# Patient Record
Sex: Female | Born: 1995 | Race: White | Hispanic: No | Marital: Married | Smoking: Never smoker
Health system: Southern US, Community
[De-identification: ages and names within clinical notes are randomized; demographics above are authoritative.]

## PROBLEM LIST (undated history)

## (undated) DIAGNOSIS — M1990 Unspecified osteoarthritis, unspecified site: Secondary | ICD-10-CM

## (undated) DIAGNOSIS — O139 Gestational [pregnancy-induced] hypertension without significant proteinuria, unspecified trimester: Secondary | ICD-10-CM

## (undated) DIAGNOSIS — F419 Anxiety disorder, unspecified: Secondary | ICD-10-CM

## (undated) DIAGNOSIS — G43909 Migraine, unspecified, not intractable, without status migrainosus: Secondary | ICD-10-CM

## (undated) DIAGNOSIS — K219 Gastro-esophageal reflux disease without esophagitis: Secondary | ICD-10-CM

## (undated) DIAGNOSIS — J189 Pneumonia, unspecified organism: Secondary | ICD-10-CM

## (undated) DIAGNOSIS — F32A Depression, unspecified: Secondary | ICD-10-CM

## (undated) DIAGNOSIS — R519 Headache, unspecified: Secondary | ICD-10-CM

## (undated) DIAGNOSIS — J45909 Unspecified asthma, uncomplicated: Secondary | ICD-10-CM

## (undated) HISTORY — PX: UPPER GASTROINTESTINAL ENDOSCOPY: SHX188

## (undated) HISTORY — DX: Unspecified asthma, uncomplicated: J45.909

## (undated) HISTORY — PX: WRIST SURGERY: SHX841

## (undated) HISTORY — PX: WISDOM TOOTH EXTRACTION: SHX21

## (undated) HISTORY — DX: Migraine, unspecified, not intractable, without status migrainosus: G43.909

## (undated) HISTORY — DX: Unspecified osteoarthritis, unspecified site: M19.90

## (undated) HISTORY — DX: Gestational (pregnancy-induced) hypertension without significant proteinuria, unspecified trimester: O13.9

## (undated) HISTORY — DX: Anxiety disorder, unspecified: F41.9

## (undated) HISTORY — PX: CHOLECYSTECTOMY, LAPAROSCOPIC: SHX56

## (undated) HISTORY — DX: Gastro-esophageal reflux disease without esophagitis: K21.9

## (undated) HISTORY — DX: Pneumonia, unspecified organism: J18.9

## (undated) HISTORY — DX: Headache, unspecified: R51.9

## (undated) HISTORY — DX: Depression, unspecified: F32.A

---
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
## 2019-12-05 DIAGNOSIS — N920 Excessive and frequent menstruation with regular cycle: Secondary | ICD-10-CM | POA: Diagnosis not present

## 2019-12-05 DIAGNOSIS — Z23 Encounter for immunization: Secondary | ICD-10-CM | POA: Diagnosis not present

## 2019-12-05 DIAGNOSIS — D84821 Immunodeficiency due to drugs: Secondary | ICD-10-CM | POA: Diagnosis not present

## 2019-12-05 DIAGNOSIS — L4050 Arthropathic psoriasis, unspecified: Secondary | ICD-10-CM | POA: Diagnosis not present

## 2020-01-09 DIAGNOSIS — R768 Other specified abnormal immunological findings in serum: Secondary | ICD-10-CM | POA: Diagnosis not present

## 2020-01-09 DIAGNOSIS — G8929 Other chronic pain: Secondary | ICD-10-CM | POA: Diagnosis not present

## 2020-01-09 DIAGNOSIS — L4059 Other psoriatic arthropathy: Secondary | ICD-10-CM | POA: Diagnosis not present

## 2020-01-09 DIAGNOSIS — M2550 Pain in unspecified joint: Secondary | ICD-10-CM | POA: Diagnosis not present

## 2020-02-10 DIAGNOSIS — R072 Precordial pain: Secondary | ICD-10-CM | POA: Diagnosis not present

## 2020-02-10 DIAGNOSIS — R051 Acute cough: Secondary | ICD-10-CM | POA: Diagnosis not present

## 2020-02-10 DIAGNOSIS — J069 Acute upper respiratory infection, unspecified: Secondary | ICD-10-CM | POA: Diagnosis not present

## 2020-02-10 DIAGNOSIS — J45909 Unspecified asthma, uncomplicated: Secondary | ICD-10-CM | POA: Diagnosis not present

## 2020-05-14 DIAGNOSIS — N898 Other specified noninflammatory disorders of vagina: Secondary | ICD-10-CM | POA: Diagnosis not present

## 2020-06-25 DIAGNOSIS — Z0000 Encounter for general adult medical examination without abnormal findings: Secondary | ICD-10-CM | POA: Diagnosis not present

## 2020-06-25 DIAGNOSIS — D84821 Immunodeficiency due to drugs: Secondary | ICD-10-CM | POA: Diagnosis not present

## 2020-06-25 DIAGNOSIS — L4050 Arthropathic psoriasis, unspecified: Secondary | ICD-10-CM | POA: Diagnosis not present

## 2020-06-25 DIAGNOSIS — F419 Anxiety disorder, unspecified: Secondary | ICD-10-CM | POA: Diagnosis not present

## 2020-07-04 DIAGNOSIS — Z20828 Contact with and (suspected) exposure to other viral communicable diseases: Secondary | ICD-10-CM | POA: Diagnosis not present

## 2020-07-04 DIAGNOSIS — R051 Acute cough: Secondary | ICD-10-CM | POA: Diagnosis not present

## 2020-07-24 DIAGNOSIS — T1490XA Injury, unspecified, initial encounter: Secondary | ICD-10-CM | POA: Diagnosis not present

## 2020-07-24 DIAGNOSIS — Z114 Encounter for screening for human immunodeficiency virus [HIV]: Secondary | ICD-10-CM | POA: Diagnosis not present

## 2020-07-24 DIAGNOSIS — Z1159 Encounter for screening for other viral diseases: Secondary | ICD-10-CM | POA: Diagnosis not present

## 2020-07-30 DIAGNOSIS — M2550 Pain in unspecified joint: Secondary | ICD-10-CM | POA: Diagnosis not present

## 2020-07-30 DIAGNOSIS — G8929 Other chronic pain: Secondary | ICD-10-CM | POA: Diagnosis not present

## 2020-07-30 DIAGNOSIS — R768 Other specified abnormal immunological findings in serum: Secondary | ICD-10-CM | POA: Diagnosis not present

## 2020-07-30 DIAGNOSIS — L4059 Other psoriatic arthropathy: Secondary | ICD-10-CM | POA: Diagnosis not present

## 2020-09-03 DIAGNOSIS — L4050 Arthropathic psoriasis, unspecified: Secondary | ICD-10-CM | POA: Diagnosis not present

## 2020-09-03 DIAGNOSIS — L680 Hirsutism: Secondary | ICD-10-CM | POA: Diagnosis not present

## 2020-10-16 DIAGNOSIS — R55 Syncope and collapse: Secondary | ICD-10-CM | POA: Diagnosis not present

## 2020-10-16 DIAGNOSIS — K219 Gastro-esophageal reflux disease without esophagitis: Secondary | ICD-10-CM | POA: Diagnosis not present

## 2020-10-16 DIAGNOSIS — R109 Unspecified abdominal pain: Secondary | ICD-10-CM | POA: Diagnosis not present

## 2020-10-16 DIAGNOSIS — Z888 Allergy status to other drugs, medicaments and biological substances status: Secondary | ICD-10-CM | POA: Diagnosis not present

## 2020-10-16 DIAGNOSIS — R42 Dizziness and giddiness: Secondary | ICD-10-CM | POA: Diagnosis not present

## 2020-10-16 DIAGNOSIS — Z79899 Other long term (current) drug therapy: Secondary | ICD-10-CM | POA: Diagnosis not present

## 2020-10-16 DIAGNOSIS — R110 Nausea: Secondary | ICD-10-CM | POA: Diagnosis not present

## 2020-10-17 DIAGNOSIS — R55 Syncope and collapse: Secondary | ICD-10-CM | POA: Diagnosis not present

## 2020-11-05 DIAGNOSIS — L4059 Other psoriatic arthropathy: Secondary | ICD-10-CM | POA: Diagnosis not present

## 2020-11-05 DIAGNOSIS — R768 Other specified abnormal immunological findings in serum: Secondary | ICD-10-CM | POA: Diagnosis not present

## 2020-11-05 DIAGNOSIS — Z113 Encounter for screening for infections with a predominantly sexual mode of transmission: Secondary | ICD-10-CM | POA: Diagnosis not present

## 2020-11-05 DIAGNOSIS — F419 Anxiety disorder, unspecified: Secondary | ICD-10-CM | POA: Diagnosis not present

## 2020-11-05 DIAGNOSIS — R82998 Other abnormal findings in urine: Secondary | ICD-10-CM | POA: Diagnosis not present

## 2020-11-05 DIAGNOSIS — Z7689 Persons encountering health services in other specified circumstances: Secondary | ICD-10-CM | POA: Diagnosis not present

## 2020-11-05 DIAGNOSIS — G8929 Other chronic pain: Secondary | ICD-10-CM | POA: Diagnosis not present

## 2020-11-05 DIAGNOSIS — N390 Urinary tract infection, site not specified: Secondary | ICD-10-CM | POA: Diagnosis not present

## 2020-11-05 DIAGNOSIS — R300 Dysuria: Secondary | ICD-10-CM | POA: Diagnosis not present

## 2020-11-05 DIAGNOSIS — M2550 Pain in unspecified joint: Secondary | ICD-10-CM | POA: Diagnosis not present

## 2020-11-05 DIAGNOSIS — L4050 Arthropathic psoriasis, unspecified: Secondary | ICD-10-CM | POA: Diagnosis not present

## 2020-11-26 DIAGNOSIS — H52222 Regular astigmatism, left eye: Secondary | ICD-10-CM | POA: Diagnosis not present

## 2020-12-10 DIAGNOSIS — Z6841 Body Mass Index (BMI) 40.0 and over, adult: Secondary | ICD-10-CM | POA: Diagnosis not present

## 2020-12-10 DIAGNOSIS — F4321 Adjustment disorder with depressed mood: Secondary | ICD-10-CM | POA: Diagnosis not present

## 2020-12-24 DIAGNOSIS — Z8759 Personal history of other complications of pregnancy, childbirth and the puerperium: Secondary | ICD-10-CM | POA: Diagnosis not present

## 2020-12-24 DIAGNOSIS — Z3169 Encounter for other general counseling and advice on procreation: Secondary | ICD-10-CM | POA: Diagnosis not present

## 2020-12-24 DIAGNOSIS — Z6841 Body Mass Index (BMI) 40.0 and over, adult: Secondary | ICD-10-CM | POA: Diagnosis not present

## 2020-12-24 DIAGNOSIS — F4321 Adjustment disorder with depressed mood: Secondary | ICD-10-CM | POA: Diagnosis not present

## 2021-01-06 DIAGNOSIS — J029 Acute pharyngitis, unspecified: Secondary | ICD-10-CM | POA: Diagnosis not present

## 2021-01-06 DIAGNOSIS — Z20822 Contact with and (suspected) exposure to covid-19: Secondary | ICD-10-CM | POA: Diagnosis not present

## 2021-01-06 DIAGNOSIS — R059 Cough, unspecified: Secondary | ICD-10-CM | POA: Diagnosis not present

## 2021-03-02 NOTE — L&D Delivery Note (Signed)
Delivery Note Called to room as patient was found to be complete and +2 station. Bed was broken down and pushing initiated. Fetal head was delivered. No nuchal was present. Shoulders and body followed without difficulty. Infant was placed on mother's abdomen, mouth and nose were bulb suctioned and let out a vigorous cry. Delayed cord clamping was performed for 60 seconds. Venous cord blood was collected. Placenta delivered spontaneously. Cervix, vagina, perineum and labia were inspected. A second degree perineal laceration and a right periurethral laceration were noted and repaired in standard fashion. Additional hemostasis achieved using pressure. TXA 1g was given as prophylaxis. Uterus fundus firm. Placenta was inspected, found to be intact with a 3 vessel cord. Counts were correct.   At 1:59 PM a viable and healthy female was delivered via Vaginal, Spontaneous (Presentation:   Occiput Anterior).  APGAR: 8, 9; weight: see infant's chart Placenta status: Spontaneous, Intact.  Cord: 3 vessels with the following complications: None.  Cord pH: N/A  Anesthesia: Epidural Episiotomy: None Lacerations: 2nd degree Suture Repair: 3.0 vicryl and 4.0 Vicryl Est. Blood Loss (mL):  300  Mom to postpartum.  Baby to Couplet care / Skin to Skin.  Steva Ready 09/23/2021, 2:39 PM

## 2021-03-21 LAB — HEPATITIS C ANTIBODY: HCV Ab: NEGATIVE

## 2021-03-21 LAB — OB RESULTS CONSOLE HEPATITIS B SURFACE ANTIGEN: Hepatitis B Surface Ag: NEGATIVE

## 2021-03-21 LAB — OB RESULTS CONSOLE RUBELLA ANTIBODY, IGM: Rubella: NON-IMMUNE/NOT IMMUNE

## 2021-03-21 LAB — OB RESULTS CONSOLE HIV ANTIBODY (ROUTINE TESTING): HIV: NONREACTIVE

## 2021-03-24 LAB — OB RESULTS CONSOLE GC/CHLAMYDIA
Chlamydia: NEGATIVE
Neisseria Gonorrhea: NEGATIVE

## 2021-03-28 ENCOUNTER — Other Ambulatory Visit: Payer: Self-pay | Admitting: Obstetrics and Gynecology

## 2021-03-28 DIAGNOSIS — Z363 Encounter for antenatal screening for malformations: Secondary | ICD-10-CM

## 2021-05-14 ENCOUNTER — Encounter: Payer: Self-pay | Admitting: *Deleted

## 2021-05-19 ENCOUNTER — Ambulatory Visit: Payer: No Typology Code available for payment source | Admitting: *Deleted

## 2021-05-19 ENCOUNTER — Ambulatory Visit: Payer: No Typology Code available for payment source | Attending: Obstetrics and Gynecology

## 2021-05-19 ENCOUNTER — Other Ambulatory Visit: Payer: Self-pay | Admitting: *Deleted

## 2021-05-19 ENCOUNTER — Other Ambulatory Visit: Payer: Self-pay

## 2021-05-19 VITALS — BP 136/73 | HR 91 | Ht 66.0 in

## 2021-05-19 DIAGNOSIS — Z6841 Body Mass Index (BMI) 40.0 and over, adult: Secondary | ICD-10-CM

## 2021-05-19 DIAGNOSIS — R638 Other symptoms and signs concerning food and fluid intake: Secondary | ICD-10-CM

## 2021-05-19 DIAGNOSIS — Z362 Encounter for other antenatal screening follow-up: Secondary | ICD-10-CM

## 2021-05-19 DIAGNOSIS — Z363 Encounter for antenatal screening for malformations: Secondary | ICD-10-CM | POA: Diagnosis present

## 2021-06-19 ENCOUNTER — Ambulatory Visit: Payer: No Typology Code available for payment source | Attending: Obstetrics

## 2021-06-19 ENCOUNTER — Ambulatory Visit: Payer: No Typology Code available for payment source | Admitting: *Deleted

## 2021-06-19 VITALS — BP 134/73 | HR 87

## 2021-06-19 DIAGNOSIS — Z3A23 23 weeks gestation of pregnancy: Secondary | ICD-10-CM | POA: Diagnosis not present

## 2021-06-19 DIAGNOSIS — O99212 Obesity complicating pregnancy, second trimester: Secondary | ICD-10-CM | POA: Insufficient documentation

## 2021-06-19 DIAGNOSIS — R638 Other symptoms and signs concerning food and fluid intake: Secondary | ICD-10-CM | POA: Diagnosis present

## 2021-06-19 DIAGNOSIS — Z362 Encounter for other antenatal screening follow-up: Secondary | ICD-10-CM | POA: Diagnosis present

## 2021-06-20 ENCOUNTER — Other Ambulatory Visit: Payer: Self-pay | Admitting: *Deleted

## 2021-06-20 DIAGNOSIS — O99212 Obesity complicating pregnancy, second trimester: Secondary | ICD-10-CM

## 2021-06-27 ENCOUNTER — Inpatient Hospital Stay (HOSPITAL_COMMUNITY)
Admission: AD | Admit: 2021-06-27 | Discharge: 2021-06-27 | Disposition: A | Discharge status: Home / Self Care | Payer: No Typology Code available for payment source | Attending: Obstetrics and Gynecology | Admitting: Obstetrics and Gynecology

## 2021-06-27 DIAGNOSIS — R030 Elevated blood-pressure reading, without diagnosis of hypertension: Secondary | ICD-10-CM | POA: Diagnosis not present

## 2021-06-27 DIAGNOSIS — O26892 Other specified pregnancy related conditions, second trimester: Secondary | ICD-10-CM | POA: Insufficient documentation

## 2021-06-27 DIAGNOSIS — O368120 Decreased fetal movements, second trimester, not applicable or unspecified: Secondary | ICD-10-CM | POA: Diagnosis present

## 2021-06-27 DIAGNOSIS — O162 Unspecified maternal hypertension, second trimester: Secondary | ICD-10-CM | POA: Diagnosis not present

## 2021-06-27 DIAGNOSIS — Z3A24 24 weeks gestation of pregnancy: Secondary | ICD-10-CM | POA: Diagnosis not present

## 2021-06-27 LAB — URINALYSIS, ROUTINE W REFLEX MICROSCOPIC
Bilirubin Urine: NEGATIVE
Glucose, UA: NEGATIVE mg/dL
Hgb urine dipstick: NEGATIVE
Ketones, ur: NEGATIVE mg/dL
Leukocytes,Ua: NEGATIVE
Nitrite: NEGATIVE
Protein, ur: NEGATIVE mg/dL
Specific Gravity, Urine: 1.026 (ref 1.005–1.030)
pH: 6 (ref 5.0–8.0)

## 2021-06-27 LAB — CBC WITH DIFFERENTIAL/PLATELET
Abs Immature Granulocytes: 0.08 10*3/uL — ABNORMAL HIGH (ref 0.00–0.07)
Basophils Absolute: 0 10*3/uL (ref 0.0–0.1)
Basophils Relative: 0 %
Eosinophils Absolute: 0.2 10*3/uL (ref 0.0–0.5)
Eosinophils Relative: 2 %
HCT: 37.8 % (ref 36.0–46.0)
Hemoglobin: 12.8 g/dL (ref 12.0–15.0)
Immature Granulocytes: 1 %
Lymphocytes Relative: 27 %
Lymphs Abs: 3.7 10*3/uL (ref 0.7–4.0)
MCH: 29.8 pg (ref 26.0–34.0)
MCHC: 33.9 g/dL (ref 30.0–36.0)
MCV: 88.1 fL (ref 80.0–100.0)
Monocytes Absolute: 0.9 10*3/uL (ref 0.1–1.0)
Monocytes Relative: 7 %
Neutro Abs: 8.8 10*3/uL — ABNORMAL HIGH (ref 1.7–7.7)
Neutrophils Relative %: 63 %
Platelets: 292 10*3/uL (ref 150–400)
RBC: 4.29 MIL/uL (ref 3.87–5.11)
RDW: 14.5 % (ref 11.5–15.5)
WBC: 13.7 10*3/uL — ABNORMAL HIGH (ref 4.0–10.5)
nRBC: 0 % (ref 0.0–0.2)

## 2021-06-27 LAB — COMPREHENSIVE METABOLIC PANEL
ALT: 10 U/L (ref 0–44)
AST: 10 U/L — ABNORMAL LOW (ref 15–41)
Albumin: 2.8 g/dL — ABNORMAL LOW (ref 3.5–5.0)
Alkaline Phosphatase: 53 U/L (ref 38–126)
Anion gap: 9 (ref 5–15)
BUN: 6 mg/dL (ref 6–20)
CO2: 20 mmol/L — ABNORMAL LOW (ref 22–32)
Calcium: 8.9 mg/dL (ref 8.9–10.3)
Chloride: 107 mmol/L (ref 98–111)
Creatinine, Ser: 0.55 mg/dL (ref 0.44–1.00)
GFR, Estimated: >60 mL/min (ref >60)
Glucose, Bld: 89 mg/dL (ref 70–99)
Potassium: 3.8 mmol/L (ref 3.5–5.1)
Sodium: 136 mmol/L (ref 135–145)
Total Bilirubin: 0.3 mg/dL (ref 0.3–1.2)
Total Protein: 6 g/dL — ABNORMAL LOW (ref 6.5–8.1)

## 2021-06-27 NOTE — MAU Note (Signed)
Unable to find FHR on external monitor. Notified Vernice Jefferson, NP. Used bedside ultrasound to find FHR and saw a lot of fetal movement. Still unable to find FHR by external monitor after bedside ultrasound. Gave patient movement "clicker" to use when she feels movements. Pt stated she felt some movement since being in MAU. ?

## 2021-06-27 NOTE — MAU Provider Note (Addendum)
?History  ?  ? ?CSN: [id number] ? ?Arrival date and time: 06/27/21 1725 ? ? ? ?Chief Complaint  ?Patient presents with  ? Decreased Fetal Movement  ? ?[name] is a 26 y.o. G1P0 at [redacted]w[redacted]d who presents to MAU for decreased fetal movement. Patient states the baby normally moves a lot throughout the day, but states that this morning she only felt the baby move about 3 times and got concerned and came to MAU for evaluation. Patient has no other concerns today other than being anxious over the baby not moving. ? ?Pt denies VB, LOF, ctx, vaginal discharge/odor/itching. ?Pt denies N/V, abdominal pain, constipation, diarrhea, or urinary problems. ?Pt denies fever, chills, fatigue, sweating or changes in appetite. ?Pt denies SOB or chest pain. ?Pt denies dizziness, HA, light-headedness, weakness. ? ? ?OB History   ? ? Gravida  ?1  ? Para  ?   ? Term  ?   ? Preterm  ?   ? AB  ?   ? Living  ?   ?  ? ? SAB  ?   ? IAB  ?   ? Ectopic  ?   ? Multiple  ?   ? Live Births  ?   ?   ?  ?  ? ? ?Past Medical History:  ?Diagnosis Date  ? Anxiety   ? Arthritis   ? Asthma   ? Depression   ? GERD (gastroesophageal reflux disease)   ? Headache   ? Pneumonia   ? ? ?Past Surgical History:  ?Procedure Laterality Date  ? [place], [place]  [id number]  ? UPPER GASTROINTESTINAL ENDOSCOPY    ? [place] EXTRACTION  [id number]  ? WRIST SURGERY  [phone]  ? WRIST SURGERY  08/2015  ? ? ?Family History  ?Problem Relation Age of Onset  ? Diabetes Mother   ? Hypertension Mother   ? Diabetes Father   ? Hypertension Father   ? ? ?Social History  ? ?Tobacco Use  ? Smoking status: Never  ? Smokeless tobacco: Never  ?Vaping Use  ? Vaping Use: Never used  ?Substance Use Topics  ? Alcohol use: No  ?  Alcohol/week: 0.0 standard drinks  ? Drug use: No  ? ? ?Allergies: No Known Allergies ? ?Medications Prior to Admission  ?Medication Sig Dispense Refill Last Dose  ? Adalimumab (HUMIRA) 40 MG/0.4ML PSKT Inject 40 mg into the skin once a week.     ?  albuterol (VENTOLIN HFA) 108 (90 Base) MCG/ACT inhaler Inhale 2 puffs into the lungs every 4 (four) hours as needed for wheezing or shortness of breath.     ? butalbital-acetaminophen-caffeine (FIORICET) 50-325-40 MG tablet Take by mouth every 4 (four) hours as needed for headache.     ? etonogestrel (NEXPLANON) 68 MG IMPL implant 1 each by Subdermal route once. (Patient not taking: Reported on 05/19/2021)     ? famotidine (PEPCID) 20 MG tablet Take 20 mg by mouth 2 (two) times daily.     ? polyethylene glycol (MIRALAX / GLYCOLAX) 17 g packet Take 17 g by mouth daily.     ? Prenatal Vit-Fe Fumarate-FA (MULTIVITAMIN-PRENATAL) 27-0.8 MG TABS tablet Take 1 tablet by mouth daily at 12 noon.     ? ? ?Review of Systems  ?Constitutional:  Negative for chills, diaphoresis, fatigue and fever.  ?Eyes:  Negative for visual disturbance.  ?Respiratory:  Negative for shortness of breath.   ?Cardiovascular:  Negative for chest pain.  ?Gastrointestinal:  Negative for  abdominal pain, constipation, diarrhea, nausea and vomiting.  ?Genitourinary:  Negative for dysuria, flank pain, frequency, pelvic pain, urgency, vaginal bleeding and vaginal discharge.  ?Neurological:  Negative for dizziness, weakness, light-headedness and headaches.  ? ?Physical Exam  ? ?Blood pressure 130/64, pulse 84, temperature 98.8 ?F (37.1 ?C), temperature source Oral, resp. rate 20, height 5\' 6"  (1.676 m), weight (!) 178.8 kg, last menstrual period 01/04/2021, SpO2 100 %. ? ?Patient Vitals for the past 24 hrs: ? BP Temp Temp src Pulse Resp SpO2 Height Weight  ?06/27/21 2115 130/64 -- -- 84 -- 100 % -- --  ?06/27/21 2100 124/69 -- -- 86 -- 98 % -- --  ?06/27/21 2045 108/65 -- -- 85 -- 99 % -- --  ?06/27/21 2030 (!) 111/59 -- -- 89 -- 99 % -- --  ?06/27/21 2015 108/61 -- -- 88 -- 99 % -- --  ?06/27/21 2000 121/67 -- -- 84 -- 100 % -- --  ?06/27/21 1945 113/61 -- -- 86 -- 99 % -- --  ?06/27/21 1944 122/62 -- -- 86 -- -- -- --  ?06/27/21 1930 -- -- -- -- -- 100 %  -- --  ?06/27/21 1919 -- -- -- -- -- 99 % -- --  ?06/27/21 1917 127/66 -- -- 91 -- -- -- --  ?06/27/21 1744 (!) 141/82 98.8 ?F (37.1 ?C) Oral 100 20 100 % 5\' 6"  (1.676 m) (!) 178.8 kg  ? ?Physical Exam ?Vitals and nursing note reviewed.  ?Constitutional:   ?   General: She is not in acute distress. ?   Appearance: Normal appearance. She is not ill-appearing, toxic-appearing or diaphoretic.  ?HENT:  ?   Head: Normocephalic and atraumatic.  ?Pulmonary:  ?   Effort: Pulmonary effort is normal.  ?Neurological:  ?   Mental Status: She is alert and oriented to person, place, and time.  ?Psychiatric:     ?   Mood and Affect: Mood normal.     ?   Behavior: Behavior normal.     ?   Thought Content: Thought content normal.     ?   Judgment: Judgment normal.  ? ?Results for orders placed or performed during the hospital encounter of 06/27/21 (from the past 24 hour(s))  ?Urinalysis, Routine w reflex microscopic Urine, Clean Catch     Status: Abnormal  ? Collection Time: 06/27/21  5:48 PM  ?Result Value Ref Range  ? Color, Urine YELLOW YELLOW  ? APPearance HAZY (A) CLEAR  ? Specific Gravity, Urine 1.026 1.005 - 1.030  ? pH 6.0 5.0 - 8.0  ? Glucose, UA NEGATIVE NEGATIVE mg/dL  ? Hgb urine dipstick NEGATIVE NEGATIVE  ? Bilirubin Urine NEGATIVE NEGATIVE  ? Ketones, ur NEGATIVE NEGATIVE mg/dL  ? Protein, ur NEGATIVE NEGATIVE mg/dL  ? Nitrite NEGATIVE NEGATIVE  ? Leukocytes,Ua NEGATIVE NEGATIVE  ?CBC with Differential/Platelet     Status: Abnormal  ? Collection Time: 06/27/21  7:32 PM  ?Result Value Ref Range  ? WBC 13.7 (H) 4.0 - 10.5 K/uL  ? RBC 4.29 3.87 - 5.11 MIL/uL  ? Hemoglobin 12.8 12.0 - 15.0 g/dL  ? HCT 37.8 36.0 - 46.0 %  ? MCV 88.1 80.0 - 100.0 fL  ? MCH 29.8 26.0 - 34.0 pg  ? MCHC 33.9 30.0 - 36.0 g/dL  ? RDW 14.5 11.5 - 15.5 %  ? Platelets 292 150 - 400 K/uL  ? nRBC 0.0 0.0 - 0.2 %  ? Neutrophils Relative % 63 %  ? Neutro Abs 8.8 (H)  1.7 - 7.7 K/uL  ? Lymphocytes Relative 27 %  ? Lymphs Abs 3.7 0.7 - 4.0 K/uL  ?  Monocytes Relative 7 %  ? Monocytes Absolute 0.9 0.1 - 1.0 K/uL  ? Eosinophils Relative 2 %  ? Eosinophils Absolute 0.2 0.0 - 0.5 K/uL  ? Basophils Relative 0 %  ? Basophils Absolute 0.0 0.0 - 0.1 K/uL  ? Immature Granulocytes 1 %  ? Abs Immature Granulocytes 0.08 (H) 0.00 - 0.07 K/uL  ?Comprehensive metabolic panel     Status: Abnormal  ? Collection Time: 06/27/21  7:32 PM  ?Result Value Ref Range  ? Sodium 136 135 - 145 mmol/L  ? Potassium 3.8 3.5 - 5.1 mmol/L  ? Chloride 107 98 - 111 mmol/L  ? CO2 20 (L) 22 - 32 mmol/L  ? Glucose, Bld 89 70 - 99 mg/dL  ? BUN 6 6 - 20 mg/dL  ? Creatinine, Ser 0.55 0.44 - 1.00 mg/dL  ? Calcium 8.9 8.9 - 10.3 mg/dL  ? Total Protein 6.0 (L) 6.5 - 8.1 g/dL  ? Albumin 2.8 (L) 3.5 - 5.0 g/dL  ? AST 10 (L) 15 - 41 U/L  ? ALT 10 0 - 44 U/L  ? Alkaline Phosphatase 53 38 - 126 U/L  ? Total Bilirubin 0.3 0.3 - 1.2 mg/dL  ? GFR, Estimated >60 >60 mL/min  ? Anion gap 9 5 - 15  ? ? ?MAU Course  ?Procedures ? ?MDM ?-DFM at 24 weeks ?-monitoring unable to occur d/t body habitus ?Pt informed that the ultrasound is considered a limited OB ultrasound and is not intended to be a complete ultrasound exam.  Patient also informed that the ultrasound is not being completed with the intent of assessing for fetal or placental anomalies or any pelvic abnormalities.  Explained that the purpose of today?s ultrasound is to assess for viability (FHR 141).  Patient acknowledges the purpose of the exam and the limitations of the study.  ?-pt unable to be monitored for EFM, but provided with clicker to mark FM,  pt marked 35 movements in 1 hour. ?-single elevated BP in MAU, PEC labs ordered ?-UA: hazy ?-CBC: H/H 12.8/37.8, platelets 292 ?-CMP and Pcr pending, care transferred to [facility]. Drake Leach, CNM ?NugentGerrie Nordmann, NP  ?8:57 PM ?06/27/2021 ? ?Assumed care: Labs reviewed.  No signs of PEC.  Normotensive.  Feeling good FM. Stable for discharge. PCR pending at time of discharge d/t being sent to outside  lab. ? ?Assessment and Plan  ? ?1. [redacted] weeks gestation of pregnancy   ?2. Elevated blood pressure affecting pregnancy in second trimester, antepartum   ? ?Discharge home ?Follow-up at [facility] in 1 week ?PEC precautions ?[facility]'

## 2021-06-27 NOTE — MAU Note (Signed)
...  Tiffany Miles is a 26 y.o. at [redacted]w[redacted]d here in MAU reporting: DFM for the past 24 hours. She reports she has felt the baby move 3 times since this morning. Denies VB or LOF.  ? ?Pain score: Denies pain. ? ?FHT: 150 external ? ? ?

## 2021-06-28 LAB — PROTEIN / CREATININE RATIO, URINE
Creatinine, Urine: 271.07 mg/dL
Protein Creatinine Ratio: 0.03 mg/mg{Cre} (ref 0.00–0.15)
Total Protein, Urine: 8 mg/dL

## 2021-07-18 ENCOUNTER — Encounter (HOSPITAL_COMMUNITY): Payer: Self-pay | Admitting: Obstetrics and Gynecology

## 2021-07-18 ENCOUNTER — Inpatient Hospital Stay (HOSPITAL_COMMUNITY)
Admission: AD | Admit: 2021-07-18 | Discharge: 2021-07-22 | DRG: 833 | Disposition: A | Discharge status: Home / Self Care | Payer: No Typology Code available for payment source | Attending: Obstetrics and Gynecology | Admitting: Obstetrics and Gynecology

## 2021-07-18 ENCOUNTER — Other Ambulatory Visit: Payer: Self-pay

## 2021-07-18 DIAGNOSIS — G43919 Migraine, unspecified, intractable, without status migrainosus: Secondary | ICD-10-CM

## 2021-07-18 DIAGNOSIS — O368130 Decreased fetal movements, third trimester, not applicable or unspecified: Secondary | ICD-10-CM | POA: Diagnosis present

## 2021-07-18 DIAGNOSIS — O99343 Other mental disorders complicating pregnancy, third trimester: Secondary | ICD-10-CM | POA: Diagnosis present

## 2021-07-18 DIAGNOSIS — F419 Anxiety disorder, unspecified: Secondary | ICD-10-CM | POA: Diagnosis present

## 2021-07-18 DIAGNOSIS — F32A Depression, unspecified: Secondary | ICD-10-CM | POA: Diagnosis present

## 2021-07-18 DIAGNOSIS — O99213 Obesity complicating pregnancy, third trimester: Secondary | ICD-10-CM | POA: Diagnosis present

## 2021-07-18 DIAGNOSIS — Z3A27 27 weeks gestation of pregnancy: Secondary | ICD-10-CM

## 2021-07-18 DIAGNOSIS — I10 Essential (primary) hypertension: Secondary | ICD-10-CM

## 2021-07-18 DIAGNOSIS — O132 Gestational [pregnancy-induced] hypertension without significant proteinuria, second trimester: Principal | ICD-10-CM | POA: Diagnosis present

## 2021-07-18 DIAGNOSIS — G4440 Drug-induced headache, not elsewhere classified, not intractable: Secondary | ICD-10-CM | POA: Diagnosis not present

## 2021-07-18 DIAGNOSIS — O133 Gestational [pregnancy-induced] hypertension without significant proteinuria, third trimester: Secondary | ICD-10-CM | POA: Diagnosis present

## 2021-07-18 DIAGNOSIS — O139 Gestational [pregnancy-induced] hypertension without significant proteinuria, unspecified trimester: Secondary | ICD-10-CM

## 2021-07-18 DIAGNOSIS — Z3A28 28 weeks gestation of pregnancy: Secondary | ICD-10-CM | POA: Diagnosis not present

## 2021-07-18 LAB — COMPREHENSIVE METABOLIC PANEL
ALT: 13 U/L (ref 0–44)
AST: 13 U/L — ABNORMAL LOW (ref 15–41)
Albumin: 2.7 g/dL — ABNORMAL LOW (ref 3.5–5.0)
Alkaline Phosphatase: 61 U/L (ref 38–126)
Anion gap: 8 (ref 5–15)
BUN: <5 mg/dL — ABNORMAL LOW (ref 6–20)
CO2: 22 mmol/L (ref 22–32)
Calcium: 8.7 mg/dL — ABNORMAL LOW (ref 8.9–10.3)
Chloride: 108 mmol/L (ref 98–111)
Creatinine, Ser: 0.58 mg/dL (ref 0.44–1.00)
GFR, Estimated: >60 mL/min (ref >60)
Glucose, Bld: 83 mg/dL (ref 70–99)
Potassium: 3.8 mmol/L (ref 3.5–5.1)
Sodium: 138 mmol/L (ref 135–145)
Total Bilirubin: 0.2 mg/dL — ABNORMAL LOW (ref 0.3–1.2)
Total Protein: 6 g/dL — ABNORMAL LOW (ref 6.5–8.1)

## 2021-07-18 LAB — TYPE AND SCREEN
ABO/RH(D): O NEG
Antibody Screen: NEGATIVE

## 2021-07-18 LAB — CBC
HCT: 36 % (ref 36.0–46.0)
Hemoglobin: 12.2 g/dL (ref 12.0–15.0)
MCH: 29.8 pg (ref 26.0–34.0)
MCHC: 33.9 g/dL (ref 30.0–36.0)
MCV: 88 fL (ref 80.0–100.0)
Platelets: 294 10*3/uL (ref 150–400)
RBC: 4.09 MIL/uL (ref 3.87–5.11)
RDW: 14.2 % (ref 11.5–15.5)
WBC: 11.9 10*3/uL — ABNORMAL HIGH (ref 4.0–10.5)
nRBC: 0 % (ref 0.0–0.2)

## 2021-07-18 LAB — OB RESULTS CONSOLE RPR: RPR: NONREACTIVE

## 2021-07-18 LAB — URINALYSIS, ROUTINE W REFLEX MICROSCOPIC
Bilirubin Urine: NEGATIVE
Glucose, UA: NEGATIVE mg/dL
Hgb urine dipstick: NEGATIVE
Ketones, ur: NEGATIVE mg/dL
Nitrite: NEGATIVE
Protein, ur: NEGATIVE mg/dL
Specific Gravity, Urine: 1.01 (ref 1.005–1.030)
pH: 7 (ref 5.0–8.0)

## 2021-07-18 LAB — PROTEIN / CREATININE RATIO, URINE
Creatinine, Urine: 71.12 mg/dL
Total Protein, Urine: <6 mg/dL

## 2021-07-18 MED ORDER — PANTOPRAZOLE SODIUM 40 MG IV SOLR
40.0000 mg | INTRAVENOUS | Status: DC
  Administered 2021-07-18 – 2021-07-20 (×3): 40 mg via INTRAVENOUS
  Filled 2021-07-18 (×3): qty 10

## 2021-07-18 MED ORDER — LACTATED RINGERS IV SOLN: INTRAVENOUS | Status: DC

## 2021-07-18 MED ORDER — LABETALOL HCL 5 MG/ML IV SOLN: 40.0000 mg | INTRAVENOUS | Status: DC | PRN

## 2021-07-18 MED ORDER — ACETAMINOPHEN 325 MG PO TABS: 650.0000 mg | ORAL_TABLET | ORAL | Status: DC | PRN

## 2021-07-18 MED ORDER — SODIUM CHLORIDE 0.9% FLUSH: 3.0000 mL | INTRAVENOUS | Status: DC | PRN

## 2021-07-18 MED ORDER — METOCLOPRAMIDE HCL 5 MG/ML IJ SOLN
10.0000 mg | Freq: Three times a day (TID) | INTRAMUSCULAR | Status: DC | PRN
  Administered 2021-07-18: 10 mg via INTRAVENOUS
  Filled 2021-07-18: qty 2

## 2021-07-18 MED ORDER — SODIUM CHLORIDE 0.9% FLUSH
3.0000 mL | Freq: Two times a day (BID) | INTRAVENOUS | Status: DC
  Administered 2021-07-18 – 2021-07-21 (×3): 3 mL via INTRAVENOUS

## 2021-07-18 MED ORDER — CALCIUM CARBONATE ANTACID 500 MG PO CHEW: 2.0000 | CHEWABLE_TABLET | ORAL | Status: DC | PRN

## 2021-07-18 MED ORDER — BUTALBITAL-APAP-CAFFEINE 50-325-40 MG PO TABS
2.0000 | ORAL_TABLET | Freq: Once | ORAL | Status: AC
  Administered 2021-07-18: 2 via ORAL
  Filled 2021-07-18: qty 2

## 2021-07-18 MED ORDER — ZOLPIDEM TARTRATE 5 MG PO TABS: 5.0000 mg | ORAL_TABLET | Freq: Every evening | ORAL | Status: DC | PRN

## 2021-07-18 MED ORDER — PRENATAL MULTIVITAMIN CH
1.0000 | ORAL_TABLET | Freq: Every day | ORAL | Status: DC
  Administered 2021-07-19 – 2021-07-21 (×3): 1 via ORAL
  Filled 2021-07-18 (×3): qty 1

## 2021-07-18 MED ORDER — BETAMETHASONE SOD PHOS & ACET 6 (3-3) MG/ML IJ SUSP
12.0000 mg | INTRAMUSCULAR | Status: AC
  Administered 2021-07-18 – 2021-07-19 (×2): 12 mg via INTRAMUSCULAR
  Filled 2021-07-18: qty 5

## 2021-07-18 MED ORDER — LABETALOL HCL 5 MG/ML IV SOLN: 80.0000 mg | INTRAVENOUS | Status: DC | PRN

## 2021-07-18 MED ORDER — ALBUTEROL SULFATE (2.5 MG/3ML) 0.083% IN NEBU: 3.0000 mL | INHALATION_SOLUTION | RESPIRATORY_TRACT | Status: DC | PRN

## 2021-07-18 MED ORDER — DOCUSATE SODIUM 100 MG PO CAPS
100.0000 mg | ORAL_CAPSULE | Freq: Every day | ORAL | Status: DC
  Administered 2021-07-19 – 2021-07-22 (×4): 100 mg via ORAL
  Filled 2021-07-18 (×4): qty 1

## 2021-07-18 MED ORDER — HYDRALAZINE HCL 20 MG/ML IJ SOLN: 10.0000 mg | INTRAMUSCULAR | Status: DC | PRN

## 2021-07-18 MED ORDER — DIPHENHYDRAMINE HCL 50 MG/ML IJ SOLN: 25.0000 mg | Freq: Four times a day (QID) | INTRAMUSCULAR | Status: DC | PRN

## 2021-07-18 MED ORDER — LABETALOL HCL 5 MG/ML IV SOLN: 20.0000 mg | INTRAVENOUS | Status: DC | PRN

## 2021-07-18 MED ORDER — ESCITALOPRAM OXALATE 10 MG PO TABS
20.0000 mg | ORAL_TABLET | Freq: Every day | ORAL | Status: DC
  Administered 2021-07-19 – 2021-07-22 (×4): 20 mg via ORAL
  Filled 2021-07-18 (×4): qty 2

## 2021-07-18 MED ORDER — FAMOTIDINE 20 MG PO TABS
20.0000 mg | ORAL_TABLET | Freq: Two times a day (BID) | ORAL | Status: DC
Start: 2021-07-18 — End: 2021-07-22
  Administered 2021-07-18 – 2021-07-22 (×8): 20 mg via ORAL
  Filled 2021-07-18 (×8): qty 1

## 2021-07-18 MED ORDER — BUTALBITAL-APAP-CAFFEINE 50-325-40 MG PO TABS: 1.0000 | ORAL_TABLET | Freq: Four times a day (QID) | ORAL | Status: DC | PRN

## 2021-07-18 MED ORDER — SODIUM CHLORIDE 0.9 % IV SOLN: 250.0000 mL | INTRAVENOUS | Status: DC | PRN

## 2021-07-18 NOTE — H&P (Signed)
ANTEPARTUM HISTORY AND PHYSICAL  Admission Date: 07/18/2021  4:49 PM  Admit Diagnosis: Gestational hypertension [O13.9] Gestational hypertension, third trimester [O13.3]   Ms.Tiffany Miles is a 26 y.o. female G1P0 @[redacted]w[redacted]d   admitted from the MAU. Patient presented with complaints of changes in her vision, specifically her right eye which all started today around 1130; this is a new problem. She also reports off and on headache today. The HA pain is in her right eye and behind her right eye. She rates the pain 4/10. The pain is constant. She has not taken anything for the pain. This does feel different than prior migraines. She reports elevated BP today and on 4/28 in MAU. Her BP was checked in the [facility] office and it was 165/84.    She did start Lexapro today 20 mg, was on this prior to pregnancy and stopped it when she found out she was pregnant.   Patient denies epigastric pain, no scotomata, endorses FM, but decreased.   History of current pregnancy: Prenatal Care with: [facility]. [facility] at [facility] Patient entered prenatal care at 11 wks.   EDC 10/11/21 by LMP 01/04/21 and congruent w/ 11 wk U/S.   Anatomy scan:  19 wks, complete w/ anterior placenta.   MFM consulted and Level 2 ultrasound performed  Significant prenatal problems: Gestational hypertension (new diagnosis) Maternal obesity affecting pregnancy BMI 62 Anxiety / Depression - on Lexapro Migraines Psoriatic arthritis Glucose intolerance / pre-diabetes Rubella non-immune Rh-negative Hep B non-immune Patient Active Problem List   Diagnosis Date Noted   Gestational hypertension 07/18/2021   Gestational hypertension, third trimester 07/18/2021   Rhinitis, allergic 12/06/2014   Mild persistent asthma 11/02/2014    Prenatal Labs: ABO, Rh:  O negative Antibody:  negative Rubella:   Non-immune RPR:   NR HBsAg:   Neg (antibody neg) HIV:   Neg 1 HR GCT: 137 GTT: PASS GBS:  Unknown GC/CHL: Neg Genetics: Panorama low risk  Horizon negative for CF, DMD, SMA Vaccines: Tdap: Y Flu Y Covid: Y     Medical / Surgical History: Past Obstetric history:  OB History  Gravida Para Term Preterm AB Living  1            SAB IAB Ectopic Multiple Live Births               # Outcome Date GA Lbr Len/2nd Weight Sex Delivery Anes PTL Lv  1 Current            Past medical history:  Past Medical History:  Diagnosis Date   Anxiety    Arthritis    Asthma    Depression    GERD (gastroesophageal reflux disease)    Headache    Pneumonia     Past surgical history:  Past Surgical History:  Procedure Laterality Date   CHOLECYSTECTOMY, LAPAROSCOPIC  100316   UPPER GASTROINTESTINAL ENDOSCOPY     WISDOM TOOTH EXTRACTION  07-27-1985   WRIST SURGERY  120115   WRIST SURGERY  08/2015   Family History:  Family History  Problem Relation Age of Onset   Diabetes Mother    Hypertension Mother    Diabetes Father    Hypertension Father     Social History:  reports that she has never smoked. She has never used smokeless tobacco. She reports that she does not drink alcohol and does not use drugs.  Allergies: Prednisone- gives migraines?   Current Medications at time of admission:  Prior to Admission medications   Medication Sig  Start Date End Date Taking? Authorizing Provider  Adalimumab (HUMIRA) 40 MG/0.4ML PSKT Inject 40 mg into the skin once a week.   Yes [provider]  albuterol (VENTOLIN HFA) 108 (90 Base) MCG/ACT inhaler Inhale 2 puffs into the lungs every 4 (four) hours as needed for wheezing or shortness of breath.   Yes [provider]  escitalopram (LEXAPRO) 20 MG tablet Take 20 mg by mouth daily.   Yes [provider]  polyethylene glycol (MIRALAX / GLYCOLAX) 17 g packet Take 17 g by mouth daily.   Yes [provider]  Prenatal Vit-Fe Fumarate-FA (MULTIVITAMIN-PRENATAL) 27-0.8 MG TABS tablet Take 1 tablet by mouth daily at 12 noon.   Yes [provider]  ranitidine  (ZANTAC) 15 MG/ML syrup Take by mouth 2 (two) times daily.   Yes [provider]  butalbital-acetaminophen-caffeine (FIORICET) 50-325-40 MG tablet Take by mouth every 4 (four) hours as needed for headache.    [provider]  famotidine (PEPCID) 20 MG tablet Take 20 mg by mouth 2 (two) times daily.    [provider]    Review of Systems: Constitutional: Negative   HENT: Negative   Eyes: Negative   Respiratory: Negative   Cardiovascular: Negative   Gastrointestinal: Negative  Genitourinary: neg for bloody show, neg for LOF   Musculoskeletal: Negative   Skin: Negative   Neurological: Headache ; visual changes Endo/Heme/Allergies: Negative   Psychiatric/Behavioral: Negative    Physical Exam: Vitals:  07/18/21 1708 07/18/21 1745 07/18/21 1801 07/18/21 1815  BP: (!) 145/87 (!) 148/74 139/71 (!) 146/80   07/18/21 1830 07/18/21 1845 07/18/21 2056  BP: 133/85 (!) 146/88 (!) 145/77    pulse 100, temperature 98 F (36.7 C), temperature source Oral, resp. rate 18, height  (1.676 m), weight (!) 180.4 kg, last menstrual period 01/04/2021, SpO2 100 %. AAO x3, no signs of distress GI: Abdomen gravid, non-tender, non-distended GU: Cervical exam deferred Extremities: 1+ edema, negative for pain, tenderness, and cords Neuro: 2+ DTRs bilaterally, no clonus  Labs:  CBC    Component Value Date/Time   WBC 11.9 (H) 07/18/2021 1741   RBC 4.09 07/18/2021 1741   HGB 12.2 07/18/2021 1741   HCT 36.0 07/18/2021 1741   PLT 294 07/18/2021 1741   MCV 88.0 07/18/2021 1741   MCH 29.8 07/18/2021 1741   MCHC 33.9 07/18/2021 1741   RDW 14.2 07/18/2021 1741   LYMPHSABS 3.7 06/27/2021 1932   MONOABS 0.9 06/27/2021 1932   EOSABS 0.2 06/27/2021 1932   BASOSABS 0.0 06/27/2021 1932    Last ultrasound dated 06/27/21 MFM completion of anatomy:  Fetal Evaluation  Num Of Fetuses:         1  Fetal Heart Rate(bpm):  148  Cardiac Activity:       Observed  Presentation:            Breech  Placenta:               Anterior  P. Cord Insertion:      Previously Visualized  Amniotic Fluid  AFI FV:      Within normal limits                              Largest Pocket(cm)                              4.53 ---------------------------------------------------------------------- Biometry  BPD:  52.9  mm     G. Age:  22w 0d        3.6  %    CI:        62.52   %    70 - 86                                                          FL/HC:      19.4   %    18.7 - 20.9  HC:      215.9  mm     G. Age:  23w 4d         30  %    HC/AC:      1.07        1.05 - 1.21  AC:      201.5  mm     G. Age:  24w 5d         75  %    FL/BPD:     79.0   %    71 - 87  FL:       41.8  mm     G. Age:  23w 4d         34  %    FL/AC:      20.7   %    20 - 24  Est. FW:     660  gm      1 lb 7 oz     60  % ---------------------------------------------------------------------- OB History  Gravidity:    1         Term:   0        Prem:   0        SAB:   0  TOP:          0       Ectopic:  0        Living: 0 ---------------------------------------------------------------------- Gestational Age  LMP:           23w 5d        Date:  01/04/21                  EDD:   10/11/21  U/S Today:     23w 3d                                        EDD:   10/13/21  Best:          23w 5d     Det. By:  LMP  (01/04/21)          EDD:   10/11/21 ---------------------------------------------------------------------- Anatomy  Cranium:               Appears normal         Aortic Arch:            Not well visualized  Cavum:                 Previously seen        Ductal Arch:            Previously seen  Ventricles:            Appears normal  Diaphragm:              Previously seen  Choroid Plexus:        Previously seen        Stomach:                Appears normal, left                                                                        sided  Cerebellum:            Previously seen        Abdomen:                Previously  seen  Posterior Fossa:       Previously seen        Abdominal Wall:         Appears nml (cord                                                                        insert, abd wall)  Nuchal Fold:           Previously seen        Cord Vessels:           Previously seen  Face:                  Orbits and profile     Kidneys:                Appear normal                         previously seen  Lips:                  Appears normal         Bladder:                Appears normal  Thoracic:              Previously seen        Spine:                  Previously seen  Heart:                 Appears normal         Upper Extremities:      Previously seen                         (4CH, axis, and                         situs)  RVOT:                  Appears normal         Lower Extremities:      Previously seen  LVOT:  Appears normal  Other:  3VV, Nasal bone, maxilla, mandible and falx visualized. Feet and          hands/5th digits previously visualized. Fetus appears to be female.          Technically difficult due to maternal habitus. ---------------------------------------------------------------------- Cervix Uterus Adnexa  Right Ovary  Not visualized.  Left Ovary  Not visualized. ---------------------------------------------------------------------- Impression  Patient returned for completion of fetal anatomy .Amniotic  fluid is normal and good fetal activity is seen .Fetal biometry  is consistent with her previously-established dates .Fetal  anatomical survey was completed and appears normal.  As maternal obesity limits resolution of images, failure to  detect anomalies are more common .  Fetal Non-Stress Test: FHR: baseline rate 130 / variability moderate / accelerations present / absent decelerations TOCO: none      Assessment: 26 y.o. G1P0 6714w6d with diagnosis of Gestational hypertension, possibly with exacerbation of migraine versus preeclampsia with severe  features.  Headache improved with fioricet given in MAU.  High risk secondary to co-morbidities including obesity. Preeclampsia labs within normal limits.   Plan:  Admit to Antepartum service Routine admission orders NST q shift MFM consultation  May get NICU consultation if impending delivery is necessary Betamethasone x 24 hours for fetal lung maturity Will defer IV magnesium sulfate for seizure prophylaxis for now, however if headaches persist, worsen or severe range BPs - will initiate Preeclampsia precautions with strict I's&O's Headache medication including Fioricet, Reglan, benadryl.  IV hydration  Essie HartWalda Myrah Strawderman MD 07/18/2021  9:50 PM

## 2021-07-18 NOTE — MAU Note (Signed)
  RN at bedside manually holding [place] to patient to attempt to get tracing from 1821 to 1850.

## 2021-07-18 NOTE — Progress Notes (Signed)
RN attempted IV on right arm. Difficult stick. Order in for IV team.

## 2021-07-18 NOTE — MAU Provider Note (Signed)
History     CSN: [id number]  Arrival date and time: 07/18/21 1649   Event Date/Time   First Provider Initiated Contact with Patient 07/18/21 1759      Chief Complaint  Patient presents with   Hypertension   Decreased Fetal Movement   HPI  Ms.Tiffany Miles is a 26 y.o. female G1P0 @[redacted]w[redacted]d   here in MAU with complaints of changes in her vision, specifically her right eye which all started today around 1130; this is a new problem. She also reports off and on headache today. The HA pain is in her right eye and behind her right eye. She rates the pain 4/10. The pain is constant. She has not taken anything for the pain. This does feel different than prior migraines. She reports elevated BP today and on 4/28 in MAU. [facility] and it was 165/84.   She did start Lexapro today 20 mg, was on this prior to pregnancy and stopped it when she found out she was pregnant.   OB History     Gravida  1   Para      Term      Preterm      AB      Living         SAB      IAB      Ectopic      Multiple      Live Births              Past Medical History:  Diagnosis Date   Anxiety    Arthritis    Asthma    Depression    GERD (gastroesophageal reflux disease)    Headache    Pneumonia     Past Surgical History:  Procedure Laterality Date   CHOLECYSTECTOMY, LAPAROSCOPIC  100316   UPPER GASTROINTESTINAL ENDOSCOPY     WISDOM TOOTH EXTRACTION  [id number]   WRIST SURGERY  120115   WRIST SURGERY  08/2015    Family History  Problem Relation Age of Onset   Diabetes Mother    Hypertension Mother    Diabetes Father    Hypertension Father     Social History   Tobacco Use   Smoking status: Never   Smokeless tobacco: Never  Vaping Use   Vaping Use: Never used  Substance Use Topics   Alcohol use: No    Alcohol/week: 0.0 standard drinks   Drug use: No    Allergies:  Allergies  Allergen Reactions   Prednisone     Medications Prior to  Admission  Medication Sig Dispense Refill Last Dose   Adalimumab (HUMIRA) 40 MG/0.4ML PSKT Inject 40 mg into the skin once a week.   07/18/2021   albuterol (VENTOLIN HFA) 108 (90 Base) MCG/ACT inhaler Inhale 2 puffs into the lungs every 4 (four) hours as needed for wheezing or shortness of breath.   Past Week   escitalopram (LEXAPRO) 20 MG tablet Take 20 mg by mouth daily.   07/18/2021   polyethylene glycol (MIRALAX / GLYCOLAX) 17 g packet Take 17 g by mouth daily.   Past Month   Prenatal Vit-Fe Fumarate-FA (MULTIVITAMIN-PRENATAL) 27-0.8 MG TABS tablet Take 1 tablet by mouth daily at 12 noon.   07/18/2021   ranitidine (ZANTAC) 15 MG/ML syrup Take by mouth 2 (two) times daily.   07/18/2021   butalbital-acetaminophen-caffeine (FIORICET) 50-325-40 MG tablet Take by mouth every 4 (four) hours as needed for headache.   More than a  month   famotidine (PEPCID) 20 MG tablet Take 20 mg by mouth 2 (two) times daily.      Results for orders placed or performed during the hospital encounter of 07/18/21 (from the past 48 hour(s))  Urinalysis, Routine w reflex microscopic Urine, Clean Catch     Status: Abnormal   Collection Time: 07/18/21  4:55 PM  Result Value Ref Range   Color, Urine YELLOW YELLOW   APPearance CLEAR CLEAR   Specific Gravity, Urine 1.010 1.005 - 1.030   pH 7.0 5.0 - 8.0   Glucose, UA NEGATIVE NEGATIVE mg/dL   Hgb urine dipstick NEGATIVE NEGATIVE   Bilirubin Urine NEGATIVE NEGATIVE   Ketones, ur NEGATIVE NEGATIVE mg/dL   Protein, ur NEGATIVE NEGATIVE mg/dL   Nitrite NEGATIVE NEGATIVE   Leukocytes,Ua TRACE (A) NEGATIVE   RBC / HPF 0-5 0 - 5 RBC/hpf   WBC, UA 0-5 0 - 5 WBC/hpf   Bacteria, UA RARE (A) NONE SEEN   Squamous Epithelial / LPF 0-5 0 - 5    Comment: Performed at [facility], [address] [place], [place] [address]  CBC     Status: Abnormal   Collection Time: 07/18/21  5:41 PM  Result Value Ref Range   WBC 11.9 (H) 4.0 - 10.5 K/uL   RBC 4.09 3.87 - 5.11 MIL/uL    Hemoglobin 12.2 12.0 - 15.0 g/dL   HCT 66.2 94.7 - 65.4 %   MCV 88.0 80.0 - 100.0 fL   MCH 29.8 26.0 - 34.0 pg   MCHC 33.9 30.0 - 36.0 g/dL   RDW 65.0 35.4 - 65.6 %   Platelets 294 150 - 400 K/uL   nRBC 0.0 0.0 - 0.2 %    Comment: Performed at [facility], [address], [place], [place] [address]  Comprehensive metabolic panel     Status: Abnormal   Collection Time: 07/18/21  5:41 PM  Result Value Ref Range   Sodium 138 135 - 145 mmol/L   Potassium 3.8 3.5 - 5.1 mmol/L   Chloride 108 98 - 111 mmol/L   CO2 22 22 - 32 mmol/L   Glucose, Bld 83 70 - 99 mg/dL    Comment: Glucose reference range applies only to samples taken after fasting for at least 8 hours.   BUN <5 (L) 6 - 20 mg/dL   Creatinine, Ser 1.70 0.44 - 1.00 mg/dL   Calcium 8.7 (L) 8.9 - 10.3 mg/dL   Total Protein 6.0 (L) 6.5 - 8.1 g/dL   Albumin 2.7 (L) 3.5 - 5.0 g/dL   AST 13 (L) 15 - 41 U/L   ALT 13 0 - 44 U/L   Alkaline Phosphatase 61 38 - 126 U/L   Total Bilirubin 0.2 (L) 0.3 - 1.2 mg/dL   GFR, Estimated >01 >74 mL/min    Comment: (NOTE) Calculated using the CKD-EPI Creatinine Equation (2021)    Anion gap 8 5 - 15    Comment: Performed at [facility], [address] [place], [place] [address]  Protein / creatinine ratio, urine     Status: None   Collection Time: 07/18/21  5:41 PM  Result Value Ref Range   Creatinine, Urine 71.12 mg/dL   Total Protein, Urine <6 mg/dL   Protein Creatinine Ratio        0.00 - 0.15 mg/mg[Cre]    Comment: RESULT BELOW REPORTABLE RANGE, UNABLE TO CALCULATE. Performed at [facility], [address], [place], [place]  [address]     Review of Systems  Eyes:  Positive for pain and visual disturbance. Negative for photophobia.  Physical Exam   Blood pressure (!) 148/74, pulse 86, temperature 98.2 F (36.8 C), temperature source Oral, resp. rate 18, height 5\' 6"  (1.676 m), weight (!) 180.4 kg, last menstrual period 01/04/2021, SpO2 100 %.  Patient Vitals for  the past 24 hrs:  BP Temp Temp src Pulse Resp SpO2 Height Weight  07/18/21 1845 (!) 146/88 -- -- 92 -- 100 % -- --  07/18/21 1830 133/85 -- -- 81 -- 100 % -- --  07/18/21 1815 (!) 146/80 -- -- 81 -- 99 % -- --  07/18/21 1801 139/71 -- -- 85 -- 99 % -- --  07/18/21 1745 (!) 148/74 -- -- 86 -- -- -- --  07/18/21 1708 (!) 145/87 98.2 F (36.8 C) Oral 94 18 100 % -- --  07/18/21 1659 -- -- -- -- -- -- 5\' 6"  (1.676 m) (!) 180.4 kg    Physical Exam Constitutional:      General: She is not in acute distress.    Appearance: She is obese. She is not ill-appearing, toxic-appearing or diaphoretic.  HENT:     Head: Normocephalic.  Musculoskeletal:        General: Swelling present. Normal range of motion.     Right lower leg: Edema present.     Left lower leg: Edema present.  Skin:    General: Skin is warm.  Neurological:     Mental Status: She is alert and oriented to person, place, and time.     Deep Tendon Reflexes: Reflexes normal.     Comments: Negative clonus    Fetal Tracing: Baseline: 130 bpm Variability: moderate  Accelerations: 10x10 Decelerations: None Toco: None  MAU Course  Procedures  MDM  PIH labs reviewed  HA is down from 4/10 to 3/10 Discussed patient with Dr. and recommendations for admission. Would consider Neuro consult. Plan for admission to [facility], Dr. 5/10 to resume care. Patient is agreeable with plan of care.   Assessment and Plan   A:  1. Gestational hypertension, antepartum   2. Intractable migraine without status migrainosus, unspecified migraine type   3. [redacted] weeks gestation of pregnancy      P:  Admit to Mora Appl, NP 07/18/2021 8:36 PM

## 2021-07-18 NOTE — MAU Note (Signed)
Tiffany Miles is a 26 y.o. at [redacted]w[redacted]d here in MAU reporting: since about 11 this morning she has had some intermittent blurry vision in her right eye. Also having intermittent headaches. Checked BP at work and it was 146/92, 158/83, and then 139/83. Went to the office and BP was 165/84. DFM.  Onset of complaint: today  Pain score: 0/10  Vitals:   07/18/21 1708  BP: (!) 145/87  Pulse: 94  Resp: 18  Temp: 98.2 F (36.8 C)  SpO2: 100%     FHT:141  Lab orders placed from triage: UA

## 2021-07-19 LAB — CBC
HCT: 38.2 % (ref 36.0–46.0)
Hemoglobin: 12.3 g/dL (ref 12.0–15.0)
MCH: 29.3 pg (ref 26.0–34.0)
MCHC: 32.2 g/dL (ref 30.0–36.0)
MCV: 91 fL (ref 80.0–100.0)
Platelets: 290 10*3/uL (ref 150–400)
RBC: 4.2 MIL/uL (ref 3.87–5.11)
RDW: 13.7 % (ref 11.5–15.5)
WBC: 14.7 10*3/uL — ABNORMAL HIGH (ref 4.0–10.5)
nRBC: 0 % (ref 0.0–0.2)

## 2021-07-19 LAB — COMPREHENSIVE METABOLIC PANEL
ALT: 11 U/L (ref 0–44)
AST: 19 U/L (ref 15–41)
Albumin: 2.7 g/dL — ABNORMAL LOW (ref 3.5–5.0)
Alkaline Phosphatase: 57 U/L (ref 38–126)
Anion gap: 11 (ref 5–15)
BUN: <5 mg/dL — ABNORMAL LOW (ref 6–20)
CO2: 16 mmol/L — ABNORMAL LOW (ref 22–32)
Calcium: 8.7 mg/dL — ABNORMAL LOW (ref 8.9–10.3)
Chloride: 110 mmol/L (ref 98–111)
Creatinine, Ser: 0.52 mg/dL (ref 0.44–1.00)
GFR, Estimated: >60 mL/min (ref >60)
Glucose, Bld: 106 mg/dL — ABNORMAL HIGH (ref 70–99)
Potassium: 4.3 mmol/L (ref 3.5–5.1)
Sodium: 137 mmol/L (ref 135–145)
Total Bilirubin: 0.8 mg/dL (ref 0.3–1.2)
Total Protein: 6 g/dL — ABNORMAL LOW (ref 6.5–8.1)

## 2021-07-19 MED ORDER — NIFEDIPINE 10 MG PO CAPS: 20.0000 mg | ORAL_CAPSULE | ORAL | Status: DC | PRN

## 2021-07-19 MED ORDER — LABETALOL HCL 5 MG/ML IV SOLN: 40.0000 mg | INTRAVENOUS | Status: DC | PRN

## 2021-07-19 MED ORDER — BUTALBITAL-APAP-CAFFEINE 50-325-40 MG PO TABS
2.0000 | ORAL_TABLET | Freq: Four times a day (QID) | ORAL | Status: DC
  Administered 2021-07-19 – 2021-07-22 (×11): 2 via ORAL
  Filled 2021-07-19 (×12): qty 2

## 2021-07-19 MED ORDER — METOCLOPRAMIDE HCL 10 MG PO TABS
10.0000 mg | ORAL_TABLET | Freq: Three times a day (TID) | ORAL | Status: DC
  Administered 2021-07-19 – 2021-07-22 (×11): 10 mg via ORAL
  Filled 2021-07-19 (×12): qty 1

## 2021-07-19 MED ORDER — NIFEDIPINE 10 MG PO CAPS: 10.0000 mg | ORAL_CAPSULE | ORAL | Status: DC | PRN

## 2021-07-20 LAB — PROTEIN, URINE, 24 HOUR
Collection Interval-UPROT: 24 hours
Protein, Urine: <6 mg/dL
Urine Total Volume-UPROT: 3600 mL

## 2021-07-20 MED ORDER — LACTATED RINGERS IV SOLN
INTRAVENOUS | Status: DC
Start: 2021-07-20 — End: 2021-07-21

## 2021-07-20 MED ORDER — MAGNESIUM SULFATE 40 GM/1000ML IV SOLN
2.0000 g/h | INTRAVENOUS | Status: DC
  Filled 2021-07-20: qty 1000

## 2021-07-20 MED ORDER — MAGNESIUM SULFATE BOLUS VIA INFUSION
6.0000 g | Freq: Once | INTRAVENOUS | Status: DC
  Filled 2021-07-20: qty 1000

## 2021-07-20 NOTE — Clinical Note (Incomplete)
2110. Lennie Odor MD of Blood pressure of 100/31 and 96/36 after magnesium Bolus dose of 6 Gms Started .  Orders received to Hold/Stop Magnesium and give Bolus dose of LR. Patient states " Felt dizzy during the last few minutes after magnesium started.  Patient denies shortness of Breath .

## 2021-07-20 NOTE — Progress Notes (Signed)
MD antepartum note - late entry Name: Tiffany Miles Medical Record Number:  [id number] Date of Birth: Oct 04, 1995 Date of Service: 07/20/2021  Tiffany Miles is a pleasant 26 year old G1P0 at 40 weeks 1 day now HD#2 s/p admission for gestational hypertension possible atypical preeclampsia versus migraine.   Patient was seen by me yesterday morning and evaluated at bedside. I neglected to write a progress note.  Today the patient was seen at bedside and examined.  She is still having a dull headache, although not as severe. She rates it a 2/10.  She is concerned about the intermittent blurry vision.   Patient denies epigastric pain.  She denies contractions, no vaginal bleeding, no leaking of fluid. Reports good FM.  The patient's past medical history and prenatal records were reviewed.  Additional issues addressed and updated today: Patient Active Problem List   Diagnosis Date Noted   Gestational hypertension 07/18/2021   Gestational hypertension, third trimester 07/18/2021   Rhinitis, allergic 12/06/2014   Mild persistent asthma 11/02/2014    Physical Examination:   Vitals:   07/20/21 1637 07/20/21 1934  BP: (!) 116/55 (!) 97/46  Pulse: 86 84  Resp: 18 18  Temp: 98.1 F (36.7 C) 98.2 F (36.8 C)  SpO2: 100% 100%   General appearance - alert, well appearing, and in no distress and oriented to person, place, and time Mental status - alert, oriented to person, place, and time, normal mood, behavior, speech, dress, motor activity, and thought processes  Abd  Soft, gravid, nontender Ex SCDs FHTs  150s, moderate variability accels no decels Toco  none Extremities - 1+ edema, SCDs in place Cervix: not evaluated  CBC    Component Value Date/Time   WBC 14.7 (H) 07/19/2021 1218   RBC 4.20 07/19/2021 1218   HGB 12.3 07/19/2021 1218   HCT 38.2 07/19/2021 1218   PLT 290 07/19/2021 1218   MCV 91.0 07/19/2021 1218   MCH 29.3 07/19/2021 1218   MCHC 32.2 07/19/2021 1218   RDW 13.7  07/19/2021 1218   LYMPHSABS 3.7 06/27/2021 1932   MONOABS 0.9 06/27/2021 1932   EOSABS 0.2 06/27/2021 1932   BASOSABS 0.0 06/27/2021 1932     CMP     Component Value Date/Time   NA 137 07/19/2021 1218   K 4.3 07/19/2021 1218   CL 110 07/19/2021 1218   CO2 16 (L) 07/19/2021 1218   GLUCOSE 106 (H) 07/19/2021 1218   BUN <5 (L) 07/19/2021 1218   CREATININE 0.52 07/19/2021 1218   CALCIUM 8.7 (L) 07/19/2021 1218   PROT 6.0 (L) 07/19/2021 1218   ALBUMIN 2.7 (L) 07/19/2021 1218   AST 19 07/19/2021 1218   ALT 11 07/19/2021 1218   ALKPHOS 57 07/19/2021 1218   BILITOT 0.8 07/19/2021 1218   GFRNONAA >60 07/19/2021 1218     Component Ref Range & Units 1 d ago  Urine Total Volume-UPROT mL 3,600   Collection Interval-UPROT hours 24   Protein, Urine mg/dL <6   Protein, 56L Urine 50 - 100 mg/day          Comment: RESULT BELOW REPORTABLE RANGE,  UNABLE TO CALCULATE.     Assessment: HD#2  [redacted]w[redacted]d with gestational hypertension possible atypical hypertension versus migraines. Discussed case with Dr. Grace Bushy (unofficially) and his concern (as is mine) is the blurry vision.  His recommendation is 24 hours of magnesium sulfate IV for seizure prophylaxis  Plan: Will initiate magnesium sulfate IV with 6 gram loading dose, followed by [name]  Discussed with patient that this will be for 24 hours.  Continue "migraine cocktail" of Fioricet, reglan and benadryl Patient is now betamethasone complete for fetal lung maturity Patient is s/p PIH labs and now a 24 hour urine all of which have been negative for preeclampsia  Essie Hart

## 2021-07-20 NOTE — Progress Notes (Signed)
Placed on EFM/Toco due to Magnesium being started. Loss of  Fetal Heart Tones being traced  due to Position and difficulty in tracing due to large abdominal Panus.  RN at bedside holding Monitor x 20 minutes .  Audible heart tones at 140-150 . No audible decels present.  Continuing to monitor at this time.

## 2021-07-21 ENCOUNTER — Inpatient Hospital Stay (HOSPITAL_BASED_OUTPATIENT_CLINIC_OR_DEPARTMENT_OTHER): Payer: No Typology Code available for payment source

## 2021-07-21 ENCOUNTER — Encounter (HOSPITAL_COMMUNITY): Payer: Self-pay | Admitting: Obstetrics & Gynecology

## 2021-07-21 DIAGNOSIS — O99213 Obesity complicating pregnancy, third trimester: Secondary | ICD-10-CM

## 2021-07-21 DIAGNOSIS — Z3A28 28 weeks gestation of pregnancy: Secondary | ICD-10-CM | POA: Diagnosis not present

## 2021-07-21 DIAGNOSIS — O139 Gestational [pregnancy-induced] hypertension without significant proteinuria, unspecified trimester: Secondary | ICD-10-CM | POA: Diagnosis not present

## 2021-07-21 DIAGNOSIS — G4440 Drug-induced headache, not elsewhere classified, not intractable: Secondary | ICD-10-CM

## 2021-07-21 DIAGNOSIS — O133 Gestational [pregnancy-induced] hypertension without significant proteinuria, third trimester: Secondary | ICD-10-CM

## 2021-07-21 MED ORDER — PANTOPRAZOLE SODIUM 40 MG PO TBEC
40.0000 mg | DELAYED_RELEASE_TABLET | Freq: Every day | ORAL | Status: DC
  Administered 2021-07-21: 40 mg via ORAL
  Filled 2021-07-21: qty 1

## 2021-07-21 MED ORDER — RHO D IMMUNE GLOBULIN 1500 UNIT/2ML IJ SOSY
300.0000 ug | PREFILLED_SYRINGE | Freq: Once | INTRAMUSCULAR | Status: AC
  Administered 2021-07-21: 300 ug via INTRAVENOUS
  Filled 2021-07-21: qty 2

## 2021-07-21 MED ORDER — CYPROHEPTADINE HCL 4 MG PO TABS: 4.0000 mg | ORAL_TABLET | Freq: Three times a day (TID) | ORAL | Status: DC | PRN

## 2021-07-21 NOTE — Progress Notes (Signed)
Antepartum Progress Note  Subjective: Patient is doing okay. She and her husband have questions regarding overall plan of care. 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 States in most recent years, she would have a migraine every 3-4 months. States they were associated with occular disturbances, specifically in the right eye, but also nausea/vomiting and photophobia. The headache she experienced on Friday at its worst was 4-5/10 and at its best and now currently is 2/10. States this headache just felt different than previous migraines.The vision changes occur intermittently now. Reports some intermittent facial edema over the last few weeks.  Last night, with the initial bolus of Magnesium 6g she had a hypotensive episode with BP of 96/37. The Magnesium sulfate was discontinued and she was given a 1L IVF bolus. She is hesitant to restart the Magnesium unless absolutely indicated. Her husband has similar concerns.  Endorses good FM. Denies VB, LOF, or CTX.  Denies fevers, chills, chest pain, SOB, RUQ/epigastric pain, N/V, dysuria, hematuria, or sudden onset/worsening bilateral LE.   Objective: BP (!) 116/59 (BP Location: Left Arm)   Pulse 83   Temp 98.5 F (36.9 C) (Oral)   Resp 19   Ht 5\' 6"  (1.676 m)   Wt (!) 180.4 kg   LMP 01/04/2021   SpO2 99%   BMI 64.17 kg/m  Gen:  NAD, pleasant and cooperative Cardio:  RRR Pulm:  CTAB, no wheezes/rales/rhonchi Abd:  Soft, gravid, non-distended, non-tender throughout, no rebound/guarding Ext:  Trace bilateral LE edema, no bilateral calf tenderness Neuro: 1+ bilateral brachioradialis DTRs, no clonus  FHT: 145-150bpm, moderate variability, + accel, - decel Toco: None  Results for orders placed or performed during the hospital encounter of 07/18/21  Urinalysis, Routine w reflex microscopic Urine, Clean Catch  Result Value Ref Range   Color, Urine YELLOW YELLOW    APPearance CLEAR CLEAR   Specific Gravity, Urine 1.010 1.005 - 1.030   pH 7.0 5.0 - 8.0   Glucose, UA NEGATIVE NEGATIVE mg/dL   Hgb urine dipstick NEGATIVE NEGATIVE   Bilirubin Urine NEGATIVE NEGATIVE   Ketones, ur NEGATIVE NEGATIVE mg/dL   Protein, ur NEGATIVE NEGATIVE mg/dL   Nitrite NEGATIVE NEGATIVE   Leukocytes,Ua TRACE (A) NEGATIVE   RBC / HPF 0-5 0 - 5 RBC/hpf   WBC, UA 0-5 0 - 5 WBC/hpf   Bacteria, UA RARE (A) NONE SEEN   Squamous Epithelial / LPF 0-5 0 - 5  CBC  Result Value Ref Range   WBC 11.9 (H) 4.0 - 10.5 K/uL   RBC 4.09 3.87 - 5.11 MIL/uL   Hemoglobin 12.2 12.0 - 15.0 g/dL   HCT 07/20/21 82.5 - 05.3 %   MCV 88.0 80.0 - 100.0 fL   MCH 29.8 26.0 - 34.0 pg   MCHC 33.9 30.0 - 36.0 g/dL   RDW 97.6 73.4 - 19.3 %   Platelets 294 150 - 400 K/uL   nRBC 0.0 0.0 - 0.2 %  Comprehensive metabolic panel  Result Value Ref Range   Sodium 138 135 - 145 mmol/L   Potassium 3.8 3.5 - 5.1 mmol/L   Chloride 108 98 - 111 mmol/L   CO2 22 22 - 32 mmol/L   Glucose, Bld 83 70 - 99 mg/dL   BUN <5 (L) 6 - 20 mg/dL   Creatinine, Ser 79.0 0.44 - 1.00 mg/dL   Calcium 8.7 (L) 8.9 - 10.3 mg/dL   Total Protein 6.0 (L) 6.5 - 8.1  g/dL   Albumin 2.7 (L) 3.5 - 5.0 g/dL   AST 13 (L) 15 - 41 U/L   ALT 13 0 - 44 U/L   Alkaline Phosphatase 61 38 - 126 U/L   Total Bilirubin 0.2 (L) 0.3 - 1.2 mg/dL   GFR, Estimated >25 >85 mL/min   Anion gap 8 5 - 15  Protein / creatinine ratio, urine  Result Value Ref Range   Creatinine, Urine 71.12 mg/dL   Total Protein, Urine <6 mg/dL   Protein Creatinine Ratio        0.00 - 0.15 mg/mg[Cre]  Protein, urine, 24 hour  Result Value Ref Range   Urine Total Volume-UPROT 3,600 mL   Collection Interval-UPROT 24 hours   Protein, Urine <6 mg/dL   Protein, 27P Urine        50 - 100 mg/day  Comprehensive metabolic panel  Result Value Ref Range   Sodium 137 135 - 145 mmol/L   Potassium 4.3 3.5 - 5.1 mmol/L   Chloride 110 98 - 111 mmol/L   CO2 16 (L) 22 - 32 mmol/L    Glucose, Bld 106 (H) 70 - 99 mg/dL   BUN <5 (L) 6 - 20 mg/dL   Creatinine, Ser 8.24 0.44 - 1.00 mg/dL   Calcium 8.7 (L) 8.9 - 10.3 mg/dL   Total Protein 6.0 (L) 6.5 - 8.1 g/dL   Albumin 2.7 (L) 3.5 - 5.0 g/dL   AST 19 15 - 41 U/L   ALT 11 0 - 44 U/L   Alkaline Phosphatase 57 38 - 126 U/L   Total Bilirubin 0.8 0.3 - 1.2 mg/dL   GFR, Estimated >23 >53 mL/min   Anion gap 11 5 - 15  CBC  Result Value Ref Range   WBC 14.7 (H) 4.0 - 10.5 K/uL   RBC 4.20 3.87 - 5.11 MIL/uL   Hemoglobin 12.3 12.0 - 15.0 g/dL   HCT 61.4 43.1 - 54.0 %   MCV 91.0 80.0 - 100.0 fL   MCH 29.3 26.0 - 34.0 pg   MCHC 32.2 30.0 - 36.0 g/dL   RDW 08.6 76.1 - 95.0 %   Platelets 290 150 - 400 K/uL   nRBC 0.0 0.0 - 0.2 %  OB RESULTS CONSOLE HIV antibody  Result Value Ref Range   HIV Non-reactive   OB RESULTS CONSOLE Rubella Antibody  Result Value Ref Range   Rubella Nonimmune   OB RESULTS CONSOLE Hepatitis B surface antigen  Result Value Ref Range   Hepatitis B Surface Ag Negative   Type and screen [facility]  Result Value Ref Range   ABO/RH(D) O NEG    Antibody Screen NEG    Sample Expiration      07/21/2021,2359 Performed at [facility], [address], [place], [place] [address]      A/P: Tiffany Miles is a 26 y.o. G1P0 @ [redacted]w[redacted]d admitted for gestational HTN with migraine vs preeclampsia with severe features (headache/visual changes).  - Preeclampsia labs negative, PCR below reportable range, 24h UTP also below reportable range - Blood pressures have been 90s-146/31-59 - Medication: None - Started Magnesium sulfate with hypotensive episode - currently held - MFM Consult placed for today - NST q shift - has been reassuring - FLM: BMZ complete 5/21 - Fioricet PRN for headaches - Will consider Neurology consult pending MFM consultation   Steva Ready, DO

## 2021-07-21 NOTE — Progress Notes (Signed)
Call was made to patient to review plan of care. S/p MFM Consultation with Dr. Noralee Space (discussed plan of care with him as well - full note to follow).  It appears that patient has a diagnosis of gestational HTN and preeclampsia is ruled out. Her headache appears consistent with her migraines with visual changes that she has been treated for previously. Per Dr. Judeth Cornfield, okay to restart Topamax (patient unable to remember prior dosing). Also, Neurology consultation considered - patient would feel better with inpatient Neuro consultation. She does not follow with Neurology outpatient any longer (previous doctor relocated).  Neurology consult placed. Likely discharge home tomorrow. We discussed importance of obtaining outpatient blood pressure cuff to check blood pressures daily and proper assessments at work. Recommend patient come to the office weekly for assessments as well.  Steva Ready, DO

## 2021-07-21 NOTE — Plan of Care (Signed)
  Problem: Education: Goal: Knowledge of General Education information will improve Description: Including pain rating scale, medication(s)/side effects and non-pharmacologic comfort measures Outcome: Progressing   Problem: Clinical Measurements: Goal: Will remain free from infection Outcome: Progressing   Problem: Clinical Measurements: Goal: Cardiovascular complication will be avoided Outcome: Progressing   Problem: Coping: Goal: Level of anxiety will decrease Outcome: Progressing   Problem: Elimination: Goal: Will not experience complications related to urinary retention Outcome: Progressing   Problem: Pain Managment: Goal: General experience of comfort will improve Outcome: Progressing   Problem: Education: Goal: Knowledge of disease or condition will improve Outcome: Progressing

## 2021-07-21 NOTE — Consult Note (Addendum)
Neurology Consultation Reason for Consult: Headache  Requesting Physician: Steva Ready  CC: Headache  History is obtained from: Patient and chart review   HPI: Annaelise Rhem is a 26 y.o. female with a past medical history significant for current pregnancy (28 weeks, 2 days), migraine headaches (with aura and at times associated with left-sided strokelike symptoms), obesity (BMI 64.17), psoriatic arthritis on Humira, anxiety/depression, asthma, and a new diagnosis of gestational hypertension  The last neurology records available to me are from August 2017, and noted that she did have improvement in her headache frequency with Topamax, with some limitation in the dosing secondary to tingling in her fingertips  Patient reports that her headaches had gotten much better, and she has been off of topiramate for at least 8 years.  Her current headaches are not the same as her prior migraine headaches.  While they are right-sided, occasionally they involve both temples, but lacks the typical migraine features that she has had before such as light sensitivity, sound sensitivity, nausea/vomiting, and improvement with rest.  She describes a vision change in her right eye that is intermittent where she feels that she cannot see as well far away but still can see up close things.  No transient loss of vision, no noted double vision, no positionality, no pulsatile tinnitus, no predilection regarding time of day.  The headaches are at worst a 4.5 in intensity, but typically are 2.5-3 on the 0-to-10 scale, and have been associated with starting Lexapro which was just started on Friday.  Otherwise she denies any recent medication changes.  She denies that these headaches are debilitating, and reports that they are tolerable, but they have not been responsive to 1000 mg of Tylenol which she has tried at home.  Additionally she notes that she has lost 30 pounds in the first trimester secondary to pregnancy related  nausea, and her sleep has been interrupted due to difficulty getting comfortable.  ROS: All other review of systems was negative except as noted in the HPI.   Past Medical History:  Diagnosis Date   Anxiety    Arthritis    Asthma    Depression    GERD (gastroesophageal reflux disease)    Headache    Pneumonia    Past Surgical History:  Procedure Laterality Date   CHOLECYSTECTOMY, LAPAROSCOPIC  100316   UPPER GASTROINTESTINAL ENDOSCOPY     WISDOM TOOTH EXTRACTION  [id number]   WRIST SURGERY  120115   WRIST SURGERY  08/2015    Current Facility-Administered Medications:    0.9 %  sodium chloride infusion, 250 mL, Intravenous, PRN, Essie Hart, MD   acetaminophen (TYLENOL) tablet 650 mg, 650 mg, Oral, Q4H PRN, Essie Hart, MD   albuterol (PROVENTIL) (2.5 MG/3ML) 0.083% nebulizer solution 3 mL, 3 mL, Inhalation, Q4H PRN, Essie Hart, MD   butalbital-acetaminophen-caffeine (FIORICET) 50-325-40 MG per tablet 2 tablet, 2 tablet, Oral, Q6H, Essie Hart, MD, 2 tablet at 07/21/21 1815   calcium carbonate (TUMS - dosed in mg elemental calcium) chewable tablet 400 mg of elemental calcium, 2 tablet, Oral, Q4H PRN, Essie Hart, MD   diphenhydrAMINE (BENADRYL) injection 25 mg, 25 mg, Intravenous, Q6H PRN, Essie Hart, MD   docusate sodium (COLACE) capsule 100 mg, 100 mg, Oral, Daily, Pinn, Walda, MD, 100 mg at 07/21/21 0843   escitalopram (LEXAPRO) tablet 20 mg, 20 mg, Oral, Daily, Pinn, Walda, MD, 20 mg at 07/21/21 0843   famotidine (PEPCID) tablet 20 mg, 20 mg, Oral, BID, Essie Hart, MD, 20  mg at 07/21/21 0843   labetalol (NORMODYNE) injection 20 mg, 20 mg, Intravenous, PRN **AND** labetalol (NORMODYNE) injection 40 mg, 40 mg, Intravenous, PRN **AND** labetalol (NORMODYNE) injection 80 mg, 80 mg, Intravenous, PRN **AND** hydrALAZINE (APRESOLINE) injection 10 mg, 10 mg, Intravenous, PRN **AND** Measure blood pressure, , , Once, Pinn, Naoma Diener, MD   NIFEdipine (PROCARDIA) capsule 10 mg, 10 mg, Oral,  PRN **AND** NIFEdipine (PROCARDIA) capsule 20 mg, 20 mg, Oral, PRN **AND** NIFEdipine (PROCARDIA) capsule 20 mg, 20 mg, Oral, PRN **AND** labetalol (NORMODYNE) injection 40 mg, 40 mg, Intravenous, PRN **AND** Measure blood pressure, , , Once, Essie Hart, MD   lactated ringers infusion, , Intravenous, Continuous, Essie Hart, MD, Last Rate: 75 mL/hr at 07/21/21 0442, Infusion Verify at 07/21/21 0442   metoCLOPramide (REGLAN) injection 10 mg, 10 mg, Intravenous, Q8H PRN, Essie Hart, MD, 10 mg at 07/18/21 2223   metoCLOPramide (REGLAN) tablet 10 mg, 10 mg, Oral, TID AC & HS, Essie Hart, MD, 10 mg at 07/21/21 1814   pantoprazole (PROTONIX) EC tablet 40 mg, 40 mg, Oral, QHS, Steva Ready, DO   prenatal multivitamin tablet 1 tablet, 1 tablet, Oral, Q1200, Essie Hart, MD, 1 tablet at 07/21/21 1038   sodium chloride flush (NS) 0.9 % injection 3 mL, 3 mL, Intravenous, Q12H, Pinn, Naoma Diener, MD, 3 mL at 07/21/21 0841   sodium chloride flush (NS) 0.9 % injection 3 mL, 3 mL, Intravenous, PRN, Essie Hart, MD   zolpidem (AMBIEN) tablet 5 mg, 5 mg, Oral, QHS PRN, Essie Hart, MD  Current Outpatient Medications  Medication Instructions   albuterol (VENTOLIN HFA) 108 (90 Base) MCG/ACT inhaler 2 puffs, Inhalation, Every 4 hours PRN   butalbital-acetaminophen-caffeine (FIORICET) 50-325-40 MG tablet Oral, Every 4 hours PRN   escitalopram (LEXAPRO) 20 mg, Oral, Daily   famotidine (PEPCID) 20 mg, Oral, 2 times daily   Humira 40 mg, Subcutaneous, Weekly   polyethylene glycol (MIRALAX / GLYCOLAX) 17 g, Oral, Daily   Prenatal Vit-Fe Fumarate-FA (MULTIVITAMIN-PRENATAL) 27-0.8 MG TABS tablet 1 tablet, Oral, Daily   ranitidine (ZANTAC) 15 MG/ML syrup Oral, 2 times daily     Family History  Problem Relation Age of Onset   Diabetes Mother    Hypertension Mother    Diabetes Father    Hypertension Father      Social History:  reports that she has never smoked. She has never used smokeless tobacco. She reports  that she does not drink alcohol and does not use drugs.   Exam: Current vital signs: BP (!) 130/57 (BP Location: Left Arm)   Pulse 80   Temp 98.2 F (36.8 C) (Oral)   Resp 18   Ht 5\' 6"  (1.676 m)   Wt (!) 180.4 kg   LMP 01/04/2021   SpO2 99%   BMI 64.17 kg/m  Vital signs in last 24 hours: Temp:  [98.2 F (36.8 C)-99.3 F (37.4 C)] 98.2 F (36.8 C) (05/22 1217) Pulse Rate:  [74-101] 80 (05/22 1641) Resp:  [18-20] 18 (05/22 1641) BP: (96-146)/(31-64) 130/57 (05/22 1641) SpO2:  [98 %-100 %] 99 % (05/22 1641)   Physical Exam  Constitutional: Appears well-developed and well-nourished.  Psych: Affect appropriate to situation, calm and cooperative Eyes: No scleral injection HENT: No oropharyngeal obstruction.  MSK: No joint deformities.  Cardiovascular: Perfusing extremities well Respiratory: Effort normal, non-labored breathing GI: Soft.  No distension. There is no tenderness.  Skin: Warm dry and intact visible skin  Neuro: Mental Status: Patient is awake, alert, oriented to person, place, month,  year, and situation. Patient is able to give a clear and coherent history. No signs of aphasia or neglect Cranial Nerves: II: Visual Fields are full. Pupils are equal, round, and reactive to light.  Optic discs are crisp bilaterally III,IV, VI: EOMI without ptosis, but does note slight blurred vision on lateral eye movement bilaterally V: Facial sensation is symmetric to temperature VII: Facial movement is symmetric.  VIII: hearing is intact to voice X: Uvula elevates symmetrically XI: Shoulder shrug is symmetric. XII: tongue is midline without atrophy or fasciculations.  Motor: Tone is normal. Bulk is normal. 5/5 strength was present in all four extremities.  Sensory: Sensation is symmetric to light touch and temperature in the arms and legs. Deep Tendon Reflexes: 2+ and symmetric in the brachioradialis and patellae.  Plantars: Toes are downgoing bilaterally.   Cerebellar: FNF and HKS are intact bilaterally Gait:  Deferred   I have reviewed labs in epic and the results pertinent to this consultation are:  Basic Metabolic Panel: Recent Labs  Lab 07/18/21 1741 07/19/21 1218  NA 138 137  K 3.8 4.3  CL 108 110  CO2 22 16*  GLUCOSE 83 106*  BUN <5* <5*  CREATININE 0.58 0.52  CALCIUM 8.7* 8.7*    CBC: Recent Labs  Lab 07/18/21 1741 07/19/21 1218  WBC 11.9* 14.7*  HGB 12.2 12.3  HCT 36.0 38.2  MCV 88.0 91.0  PLT 294 290    Coagulation Studies: No results for input(s): LABPROT, INR in the last 72 hours.    I have reviewed the images obtained: No CNS imaging prior to my consultation   Impression: This is a 26 year old woman, [redacted] weeks pregnant, presenting with new headache in the setting of recently starting Lexapro.  Certainly medication side effect could be a consideration. Given her blurred vision on lateral gaze bilaterally and her risk factors of pregnancy and BMI, will additionally obtain MRI brain and MRV to rule out CVST, especially given that this is not her typical migraine and does not have many of the typical migrainous features.  Regarding starting topiramate, given that the headache has not been debilitating for her and given topiramate and a small study has been found to cross the placenta and is associated with risk of cleft palate especially in the first trimester, I do not think that the risks of topiramate outweigh the benefits at this time.  Have discussed starting cyproheptadine, which is associated with weight gain and this was discussed with the patient.  If she has highly increased appetite with this medication, and headache becomes more irritating, topiramate or a different agent could be considered at that time  Recommendations -MRI brain without contrast, MRV -Cyproheptadine 4 mg every 8 hours as needed -Continue Tylenol 1000 mg every 8 hours as needed -Consider alternative to Lexapro -Outpatient  neurology follow-up, referral to Dr. Lucia Gaskins placed -Neurology will follow up MRI brain and MRV but otherwise will be available on an as-needed basis, please reach out if new questions or concerns arise  Brooke Dare MD-PhD [facility] [phone] Available 7 PM to 7 AM, outside of these hours please call Neurologist on call as listed on Amion.   Addendum: MRI brain and MRV personally reviewed, agree with radiology there is no evidence of an acute intracranial process.  Remainder of recommendations as above

## 2021-07-21 NOTE — Consult Note (Signed)
Maternal-Fetal Medicine   Name: Javona Bergevin  DOB: 1995/09/21 MRN: [id number] Referring Provider: Steva Ready, DO  I had the pleasure of seeing Ms. Tolleson [facility] at [facility]. She is G1 P0 at 28w 2d gestation and was admitted on 07/18/21 with increased blood pressures.  Patient complains of headaches (mainly on the right side) on and off, moderate to severe with visual disturbances (blurred vision).  Blood pressure at your office was 165/84 mmHg.  At [facility] evaluation, the blood pressure is where in the mild hypertensive range.  Since admission, she received antenatal corticosteroids.  Magnesium sulfate bolus was given but had to be discontinued because of hypotension.  Still has persistent visual disturbances and headache.  No history of right upper quadrant pain or vaginal bleeding.  Past medical history: Migraine.  Patient was taking topiramate before pregnancy.  No history of diabetes or hypertension.  She has psoriatic arthritis and takes Humira. Prenatal course: She does not have gestational diabetes.  Patient received RhoGAM. Past surgical history: Laparoscopic cholecystectomy (2016). Allergies: Prednisone (causes migraine). Medications: Prenatal vitamins, Zantac, Humira weekly, Zyrtec as needed, Lexapro 20 mg daily Social history: Denies tobacco or drug or alcohol use.  She has been married close to 2 years.  P/E: Patient is comfortably sitting up in bed; not in distress. Vitals: BP 130/57 mmHg, pulse 80/minute, RR 18/minute, afebrile. Blood pressures range 101-126/50-59 mmHg since morning. HEENT: No lymphadenopathy; no thyromegaly. Abdomen soft gravid uterus; no tenderness in any of the quadrants. No flank tenderness. Pedal edema present.  Labs Hemoglobin 12.3, hematocrit 38.2, WBC 14.7, platelets 290.  Electrolytes normal, creatinine 0.52, AST 19, ALT 11.  No increased proteinuria on urinalysis.    Ultrasound On today's ultrasound, amniotic fluid is  normal and good fetal activity seen.  Fetal growth is appropriate for gestational age.  Gestational hypertension without preeclampsia I explained the diagnosis of gestational hypertension.  She does not have severe features of preeclampsia.  Headache is consistent with migraine. I discussed the possible complications of preeclampsia (mainly with severe features) including eclampsia, stroke, renal failure, pulmonary edema, coagulation disturbances and placental abruption. Our goal is to continue pregnancy till [redacted] weeks gestation when delivery will be recommended.  I counseled her that if severe features develop, delivery will be at [redacted] weeks gestation or at diagnosis after 34 weeks. Her blood pressures have been stable since admission.  Outpatient management may be considered without severe features.  I recommend that patient gets a blood pressure cuff at home and bring a log book to your office.  I explained the parameters of severe blood hypertension that should prompt her to come to the hospital.  Patient will be having a neurology consultation now.  I reassured her that topiramate is safe in pregnancy.  Adalimumab (Humira) in pregnancy It is an anti-TNF drug. Patient has psoriatic arthritis that is well-controlled on humira.   Adalimumab causes the placenta.  Adalimumab and infliximab or monoclonal IgG1 antibodies can cross the placenta.  Adalimumab is present in the fetus from [redacted] weeks gestation till birth.  Adalimumab has a longer time for drug clearance and fetal circulation and the main drug clearance is about 4 months.  Breast-feeding does not increase the risk of infection.  It appears that our patient needs this medication to prevent flares.  Theoretically, adalimumab can interfere with antibody response to vaccination in infants.  Based on the available data including the [place] national vaccination program, anti-TNF therapy treatment does not impair antibody response  to nonlive  vaccination.  Nonlive vaccination can be given as usual in infants exposed in utero to anti-TNF medications.  Live vaccination should be deferred for at least 6 months. I recommend that she consult with her pediatrician on the appropriate timing of live vaccinations.  Recommendations -If blood pressures are not in the severe range, consider discharge with home blood pressure monitoring. -Weekly BPP from 30 weeks' gestation till delivery. -Delivery at 37 weeks' gestation provided severe features of preeclampsia are not present.   Thank you for consultation.  If you have any questions or concerns, please contact me the [facility].

## 2021-07-21 NOTE — Progress Notes (Signed)
Spoke with Dr. Thomasena Edis with Neurology - full consult to follow, appreciate recommendations.

## 2021-07-21 NOTE — Progress Notes (Signed)
Messaged and Called MD Pinn to update on Patients Progress . Patient V/S WNL. Awaiting call back regarding orders to continue to hold Magnesium Sulfate or restart.

## 2021-07-22 ENCOUNTER — Inpatient Hospital Stay (HOSPITAL_COMMUNITY): Payer: No Typology Code available for payment source

## 2021-07-22 LAB — RH IG WORKUP (INCLUDES ABO/RH)
Gestational Age(Wks): 28
Unit division: 0

## 2021-07-22 MED ORDER — CYPROHEPTADINE HCL 4 MG PO TABS: 4.0000 mg | ORAL_TABLET | Freq: Three times a day (TID) | ORAL | 3 refills | Status: AC | PRN

## 2021-07-22 MED ORDER — BUTALBITAL-APAP-CAFFEINE 50-325-40 MG PO TABS: 2.0000 | ORAL_TABLET | Freq: Four times a day (QID) | ORAL | 3 refills | Status: AC | PRN

## 2021-07-22 MED ORDER — PANTOPRAZOLE SODIUM 40 MG PO TBEC: 40.0000 mg | DELAYED_RELEASE_TABLET | Freq: Every day | ORAL | 3 refills | Status: AC

## 2021-07-22 NOTE — Discharge Instructions (Signed)
Blood pressure goal: Less then 140/90 Call the office if blood pressure is greater than or equal to 160/110

## 2021-07-22 NOTE — Discharge Summary (Addendum)
Physician Discharge Summary  Patient ID: Tiffany Skeenby Gayle Puzio MRN: [id number] DOB/AGE: 04-02-1995 25 y.o.  Admit date: 07/18/2021 Discharge date: 07/22/2021  Admission Diagnoses: Elevated blood pressures Third trimester pregnancy Headache Visual changes  Discharge Diagnoses:  Principal Problem:   Gestational hypertension Active Problems:   Gestational hypertension, third trimester Migraines  Discharged Condition: good  Hospital Course: Patient is a 26 y.o. G1P0 at 675w3d who was admitted on 07/18/2021 for severe range blood pressure at work and [facility] and her blood pressure was as high as 165/84. She also had a migraine with visual changes. 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 States in most recent years, she would have a migraine every 3-4 months. States they were associated with occular disturbances, specifically in the right eye, but also nausea/vomiting and photophobia. The headache she experienced on Friday at its worst was 4-5/10 and at its best and now currently is 2/10. States this headache just felt different than previous migraines.The vision changes occur intermittently now. Reports some intermittent facial edema over the last few weeks.   On 5/21 patient was given an initial bolus of Magnesium 6g and she had a hypotensive episode with BP of 96/37. The Magnesium sulfate was discontinued and she was given a 1L IVF bolus. Preeclampsia labs and PCR, and 24h UTP were unremarkable. Obstetric US (report below) was unremarkable.  Her blood pressures have been normal to mild range. Diastolic blood pressures have been 39-57. After MFM consultation, it was determined that patient has gestational hypertension and a separate migraine due to her history. Neurology consult was placed and MRI brain without contrast was completed which was unremarkable. Patient has been treated with Fioricet PRN and  Cyproheptadine PRN. Her headache resolved prior to discharge. Patient was discharged in good condition with weekly outpatient follow-up in OBGYN and with MFM for weekly fetal testing. Neurology to arrange outpatient follow-up as well. Patient plans to obtain a home blood pressure cuff for daily checks/logs.  Consults:  Maternal Fetal Medicine and Neurology  Significant Diagnostic Studies: labs:  CBC    Component Value Date/Time   WBC 14.7 (H) 07/19/2021 1218   RBC 4.20 07/19/2021 1218   HGB 12.3 07/19/2021 1218   HCT 38.2 07/19/2021 1218   PLT 290 07/19/2021 1218   MCV 91.0 07/19/2021 1218   MCH 29.3 07/19/2021 1218   MCHC 32.2 07/19/2021 1218   RDW 13.7 07/19/2021 1218   LYMPHSABS 3.7 06/27/2021 1932   MONOABS 0.9 06/27/2021 1932   EOSABS 0.2 06/27/2021 1932   BASOSABS 0.0 06/27/2021 1932      Latest Ref Rng & Units 07/19/2021   12:18 PM 07/18/2021    5:41 PM 06/27/2021    7:32 PM  CMP  Glucose 70 - 99 mg/dL 409106   83   89    BUN 6 - 20 mg/dL <5   <5   6    Creatinine 0.44 - 1.00 mg/dL 8.110.52   9.140.58   7.820.55    Sodium 135 - 145 mmol/L 137   138   136    Potassium 3.5 - 5.1 mmol/L 4.3   3.8   3.8    Chloride 98 - 111 mmol/L 110   108   107    CO2 22 - 32 mmol/L 16   22   20     Calcium 8.9 - 10.3 mg/dL 8.7   8.7   8.9    Total  Protein 6.5 - 8.1 g/dL 6.0   6.0   6.0    Total Bilirubin 0.3 - 1.2 mg/dL 0.8   0.2   0.3    Alkaline Phos 38 - 126 U/L 57   61   53    AST 15 - 41 U/L ALT 0 - 44 U/L PCR: Below reportable range 24h UTP: Below reportable range  Obstetric US: ----------------------------------------------------------------------  OBSTETRICS REPORT                        (Signed Final 07/21/2021 05:35 pm) ---------------------------------------------------------------------- Patient Info  ID #:       [id number]                          D.O.B.:  [birth year] (25 yrs)  Name:       Tiffany Miles                Visit Date: 07/21/2021 01:43  pm ---------------------------------------------------------------------- Performed By  Attending:        Noralee Space MD        Ref. Address:      [facility]                                                              [address]                                                              [address]                                                              [place], [place]                                                              [address]  Performed By:     Percell Boston          [facility]  Referred By:      Steva Ready ---------------------------------------------------------------------- Orders  #  Description  Code        Ordered By  1  [place] MFM OB FOLLOW UP                   [id number]    Rosana Hoes ----------------------------------------------------------------------  #  Order #                     Accession #                Episode #  1  [id number]                   [id number]                 [id number] ---------------------------------------------------------------------- Indications  Hypertension - Gestational                      O13.9  [redacted] weeks gestation of pregnancy                 Z3A.28  Maternal morbid obesity (PreG BMI: 62)          O99.210 E66.01  LR NIPS ---------------------------------------------------------------------- Fetal Evaluation  Num Of Fetuses:          1  Fetal Heart Rate(bpm):   129  Cardiac Activity:        Observed  Presentation:            Cephalic  Placenta:                Anterior  P. Cord Insertion:       Previously Visualized  Amniotic Fluid  AFI FV:      Within normal limits  AFI Sum(cm)     %Tile       Largest Pocket(cm)  14.9            52          6.2  RUQ(cm)       RLQ(cm)       LUQ(cm)        LLQ(cm)  2             6.2           4.5             2.2 ---------------------------------------------------------------------- Biometry  BPD:      67.8  mm     G. Age:  27w 2d         12  %    CI:        70.24   %    70 - 86                                                          FL/HC:       19.1  %    18.8 - 20.6  HC:        258  mm     G. Age:  28w 0d         14  %    HC/AC:       1.00       1.05 - 1.21  AC:      258.8  mm     G. Age:  30w 0d  89  %    FL/BPD:      72.6  %    71 - 87  FL:       49.2  mm     G. Age:  26w 4d        3.9  %    FL/AC:       19.0  %    20 - 24  Est. FW:    1241   gm    2 lb 12 oz     46  % ---------------------------------------------------------------------- OB History  Gravidity:    1         Term:   0        Prem:   0        SAB:   0  TOP:          0       Ectopic:  0        Living: 0 ---------------------------------------------------------------------- Gestational Age  LMP:           28w 2d        Date:  01/04/21                 EDD:   10/11/21  U/S Today:     28w 0d                                        EDD:   10/13/21  Best:          28w 2d     Det. By:  LMP  (01/04/21)          EDD:   10/11/21 ---------------------------------------------------------------------- Anatomy  Cranium:               Appears normal         LVOT:                   Previously seen  Cavum:                 Previously seen        Aortic Arch:            Not well visualized  Ventricles:            Previously seen        Ductal Arch:            Previously seen  Choroid Plexus:        Previously seen        Diaphragm:              Previously seen  Cerebellum:            Previously seen        Stomach:                Appears normal, left                                                                        sided  Posterior Fossa:       Previously seen        Abdomen:  Previously seen  Nuchal Fold:           Previously seen        Abdominal Wall:         Previously seen  Face:                  Orbits and  profile     Cord Vessels:           Previously seen                         previously seen  Lips:                  Previously seen        Kidneys:                Appear normal  Palate:                Not well visualized    Bladder:                Appears normal  Thoracic:              Previously seen        Spine:                  Previously seen  Heart:                 Previously seen        Upper Extremities:      Previously seen  RVOT:                  Previously seen        Lower Extremities:      Previously seen  Other:  3VV, Nasal bone, maxilla, mandible and falx visualized. Feet and          hands/5th digits previously visualized. Fetus appears to be female.          Technically difficult due to maternal habitus. ---------------------------------------------------------------------- Cervix Uterus Adnexa  Cervix  Not visualized (advanced GA >24wks)  Uterus  No abnormality visualized.  Right Ovary  No adnexal mass visualized.  Left Ovary  No adnexal mass visualized.  Cul De Sac  No free fluid seen.  Adnexa  No abnormality visualized. ---------------------------------------------------------------------- Impression  On today's ultrasound, amniotic fluid is normal and good fetal  activity seen.  Fetal growth is appropriate for gestational age.  See consultation in EPIC. ---------------------------------------------------------------------- Recommendations  -BPP in 2 weeks and then weekly till delivery. ----------------------------------------------------------------------                  Noralee Space, MD Electronically Signed Final Report   07/21/2021 05:35 pm ----------------------------------------------------------------------   MRI Brain Without Contrast CLINICAL DATA:  Vision changes, headache   EXAM: MRI HEAD WITHOUT CONTRAST   MR VENOGRAM HEAD WITHOUT CONTRAST   TECHNIQUE: Multiplanar, multi-echo pulse sequences of the brain and surrounding structures were  acquired without intravenous contrast. Angiographic images of the intracranial venous structures were acquired using MRV technique without intravenous contrast.   COMPARISON:  07/18/2014 MR head   FINDINGS: MRI HEAD WITHOUT CONTRAST   Brain: No restricted diffusion to suggest acute or subacute infarct. No acute hemorrhage, edema, mass, mass effect, or midline shift. No hemosiderin deposition to suggest remote hemorrhage. No hydrocephalus or extra-axial collection. Normal pituitary. Normal craniocervical junction.   Vascular: Normal arterial flow voids.   Skull and upper cervical  spine: Normal marrow signal.   Sinuses/Orbits: No acute finding.   Other: The mastoids are well aerated.   MR VENOGRAM HEAD WITHOUT  CONTRAST   There is no evidence of dural venous sinus or deep cerebral vein thrombosis. No dural venous sinus stenosis.   IMPRESSION: 1. No acute intracranial process. No etiology seen for the patient's headache or vision changes. 2. Negative for dural venous sinus or deep cerebral vein thrombosis.     Electronically Signed   By: Wiliam Ke M.D.   On: 07/22/2021 02:15    Treatments: See above  Discharge Exam: Blood pressure (!) 108/56, pulse 72, temperature (!) 97.5 F (36.4 C), temperature source Oral, resp. rate 16, height 5\' 6"  (1.676 m), weight (!) 180.4 kg, last menstrual period 01/04/2021, SpO2 100 %. Gen:  NAD, pleasant and cooperative Cardio:  RRR Pulm:  CTAB, no wheezes/rales/rhonchi Abd:  Soft, non-distended, non-tender throughout, no rebound/guarding Ext:  Trace bilateral LE edema, no bilateral calf tenderness  FHT: 135bpm, moderate variability, 10x10 accels, no decels Toco: None  Disposition: Discharge disposition: 01-Home or Self Care       Discharge Instructions     Ambulatory referral to Neurology   Complete by: As directed    An appointment is requested in approximately: 2 weeks      Allergies as of 07/22/2021        Reactions   Prednisone         Medication List     TAKE these medications    albuterol 108 (90 Base) MCG/ACT inhaler Commonly known as: VENTOLIN HFA Inhale 2 puffs into the lungs every 4 (four) hours as needed for wheezing or shortness of breath.   butalbital-acetaminophen-caffeine 50-325-40 MG tablet Commonly known as: FIORICET Take 2 tablets by mouth every 6 (six) hours as needed for headache. What changed:  how much to take when to take this   cyproheptadine 4 MG tablet Commonly known as: PERIACTIN Take 1 tablet (4 mg total) by mouth 3 (three) times daily as needed (headache).   escitalopram 20 MG tablet Commonly known as: LEXAPRO Take 20 mg by mouth daily.   famotidine 20 MG tablet Commonly known as: PEPCID Take 20 mg by mouth 2 (two) times daily.   Humira 40 MG/0.4ML Pskt Generic drug: Adalimumab Inject 40 mg into the skin once a week.   multivitamin-prenatal 27-0.8 MG Tabs tablet Take 1 tablet by mouth daily at 12 noon.   pantoprazole 40 MG tablet Commonly known as: PROTONIX Take 1 tablet (40 mg total) by mouth at bedtime.   polyethylene glycol 17 g packet Commonly known as: MIRALAX / GLYCOLAX Take 17 g by mouth daily.   ranitidine 15 MG/ML syrup Commonly known as: ZANTAC Take by mouth 2 (two) times daily.        Follow-up Information     07/24/2021, DO Follow up in 1 week(s).   Specialty: Obstetrics and Gynecology Why: Our office will arrange a 1 week follow-up for you. Contact information: [address] [place] [address] [place] [phone]                 Signed: [phone] 07/22/2021, 9:26 AM

## 2021-07-22 NOTE — Plan of Care (Signed)
  Problem: Health Behavior/Discharge Planning: Goal: Ability to manage health-related needs will improve Outcome: Completed/Met   Problem: Clinical Measurements: Goal: Ability to maintain clinical measurements within normal limits will improve Outcome: Completed/Met Goal: Will remain free from infection Outcome: Completed/Met Goal: Diagnostic test results will improve Outcome: Completed/Met Goal: Respiratory complications will improve Outcome: Completed/Met Goal: Cardiovascular complication will be avoided Outcome: Completed/Met   Problem: Activity: Goal: Risk for activity intolerance will decrease Outcome: Completed/Met   Problem: Nutrition: Goal: Adequate nutrition will be maintained Outcome: Completed/Met   Problem: Coping: Goal: Level of anxiety will decrease Outcome: Completed/Met   Problem: Elimination: Goal: Will not experience complications related to bowel motility Outcome: Completed/Met Goal: Will not experience complications related to urinary retention Outcome: Completed/Met   Problem: Pain Managment: Goal: General experience of comfort will improve Outcome: Completed/Met   Problem: Safety: Goal: Ability to remain free from injury will improve Outcome: Completed/Met   Problem: Skin Integrity: Goal: Risk for impaired skin integrity will decrease Outcome: Completed/Met   Problem: Education: Goal: Knowledge of disease or condition will improve Outcome: Completed/Met Goal: Knowledge of the prescribed therapeutic regimen will improve Outcome: Completed/Met Goal: Individualized Educational Video(s) Outcome: Completed/Met   Problem: Clinical Measurements: Goal: Complications related to the disease process, condition or treatment will be avoided or minimized Outcome: Completed/Met   Problem: Fluid Volume: Goal: Peripheral tissue perfusion will improve Outcome: Completed/Met   Problem: Clinical Measurements: Goal: Complications related to disease  process, condition or treatment will be avoided or minimized Outcome: Completed/Met

## 2021-07-23 ENCOUNTER — Inpatient Hospital Stay (HOSPITAL_COMMUNITY)
Admission: AD | Admit: 2021-07-23 | Discharge: 2021-07-23 | Disposition: A | Discharge status: Home / Self Care | Payer: No Typology Code available for payment source | Attending: Obstetrics and Gynecology | Admitting: Obstetrics and Gynecology

## 2021-07-23 ENCOUNTER — Encounter (HOSPITAL_COMMUNITY): Payer: Self-pay | Admitting: Obstetrics and Gynecology

## 2021-07-23 DIAGNOSIS — R0789 Other chest pain: Secondary | ICD-10-CM | POA: Diagnosis not present

## 2021-07-23 DIAGNOSIS — O133 Gestational [pregnancy-induced] hypertension without significant proteinuria, third trimester: Secondary | ICD-10-CM | POA: Diagnosis not present

## 2021-07-23 DIAGNOSIS — R079 Chest pain, unspecified: Secondary | ICD-10-CM

## 2021-07-23 DIAGNOSIS — R0602 Shortness of breath: Secondary | ICD-10-CM | POA: Diagnosis not present

## 2021-07-23 DIAGNOSIS — Z3A28 28 weeks gestation of pregnancy: Secondary | ICD-10-CM | POA: Insufficient documentation

## 2021-07-23 DIAGNOSIS — O26893 Other specified pregnancy related conditions, third trimester: Secondary | ICD-10-CM | POA: Diagnosis present

## 2021-07-23 LAB — CBC
HCT: 37.2 % (ref 36.0–46.0)
Hemoglobin: 12.3 g/dL (ref 12.0–15.0)
MCH: 29.6 pg (ref 26.0–34.0)
MCHC: 33.1 g/dL (ref 30.0–36.0)
MCV: 89.4 fL (ref 80.0–100.0)
Platelets: 289 10*3/uL (ref 150–400)
RBC: 4.16 MIL/uL (ref 3.87–5.11)
RDW: 14.2 % (ref 11.5–15.5)
WBC: 14 10*3/uL — ABNORMAL HIGH (ref 4.0–10.5)
nRBC: 0 % (ref 0.0–0.2)

## 2021-07-23 LAB — COMPREHENSIVE METABOLIC PANEL
ALT: 15 U/L (ref 0–44)
AST: 19 U/L (ref 15–41)
Albumin: 2.9 g/dL — ABNORMAL LOW (ref 3.5–5.0)
Alkaline Phosphatase: 61 U/L (ref 38–126)
Anion gap: 10 (ref 5–15)
BUN: 5 mg/dL — ABNORMAL LOW (ref 6–20)
CO2: 20 mmol/L — ABNORMAL LOW (ref 22–32)
Calcium: 8.6 mg/dL — ABNORMAL LOW (ref 8.9–10.3)
Chloride: 108 mmol/L (ref 98–111)
Creatinine, Ser: 0.6 mg/dL (ref 0.44–1.00)
GFR, Estimated: >60 mL/min (ref >60)
Glucose, Bld: 74 mg/dL (ref 70–99)
Potassium: 4.4 mmol/L (ref 3.5–5.1)
Sodium: 138 mmol/L (ref 135–145)
Total Bilirubin: 0.6 mg/dL (ref 0.3–1.2)
Total Protein: 6.2 g/dL — ABNORMAL LOW (ref 6.5–8.1)

## 2021-07-23 LAB — PROTEIN / CREATININE RATIO, URINE
Creatinine, Urine: 32.82 mg/dL
Total Protein, Urine: <6 mg/dL

## 2021-07-23 LAB — TROPONIN I (HIGH SENSITIVITY): Troponin I (High Sensitivity): 4 ng/L (ref <18)

## 2021-07-23 NOTE — MAU Note (Signed)
.  Tiffany Miles is a 26 y.o. at [redacted]w[redacted]d here in MAU reporting: chest pain/pressure (6/10) that started around 1300 while she was sitting down. Does have SOB intermittently. HA (3/10) started around 1000; denies taking Tylenol. Had blurry vision on her left side last night, denies visual changes since. Does feel dizzy at times. Denies VB or LOF. Reports good FM.   Onset of complaint: today Pain score: see note Vitals:   07/23/21 1422  BP: (!) 143/81  Pulse: 94  Resp: 20  Temp: 97.7 F (36.5 C)  SpO2: 98%     FHT:152 Lab orders placed from triage:  UA

## 2021-07-23 NOTE — MAU Provider Note (Signed)
History     [id number]  Arrival date and time: 07/23/21 1351    Chief Complaint  Patient presents with   Chest Pain     HPI [name] is a 26 y.o. at [redacted]w[redacted]d who presents for chest pain. Had an episode last night that she attributed to anxiety. Worse this morning. Reports sudden onset mid chest pressure that started around 1 pm while sitting down. Pain is intermittent and lasts about 30 minutes at a time. Feels short of breath during these episodes. No aggravating factors. Denies headache, sore throat, fever, n/v, abdominal pain, calf pain. Doesn't smoke. Hasn't had episode like this before. Denies cardiac history. Reports good fetal movement.    OB History     Gravida  1   Para      Term      Preterm      AB      Living         SAB      IAB      Ectopic      Multiple      Live Births              Past Medical History:  Diagnosis Date   Anxiety    Arthritis    Asthma    Depression    GERD (gastroesophageal reflux disease)    Headache    Pneumonia     Past Surgical History:  Procedure Laterality Date   CHOLECYSTECTOMY, LAPAROSCOPIC  100316   UPPER GASTROINTESTINAL ENDOSCOPY     WISDOM TOOTH EXTRACTION  [id number]   WRIST SURGERY  120115   WRIST SURGERY  08/2015    Family History  Problem Relation Age of Onset   Diabetes Mother    Hypertension Mother    Diabetes Father    Hypertension Father     Allergies  Allergen Reactions   Prednisone     No current facility-administered medications on file prior to encounter.   Current Outpatient Medications on File Prior to Encounter  Medication Sig Dispense Refill   albuterol (VENTOLIN HFA) 108 (90 Base) MCG/ACT inhaler Inhale 2 puffs into the lungs every 4 (four) hours as needed for wheezing or shortness of breath.     famotidine (PEPCID) 20 MG tablet Take 20 mg by mouth 2 (two) times daily.     polyethylene glycol (MIRALAX / GLYCOLAX) 17 g packet Take 17 g by mouth daily.     Prenatal  Vit-Fe Fumarate-FA (MULTIVITAMIN-PRENATAL) 27-0.8 MG TABS tablet Take 1 tablet by mouth daily at 12 noon.     Adalimumab (HUMIRA) 40 MG/0.4ML PSKT Inject 40 mg into the skin once a week.       ROS Pertinent positives and negative per HPI, all others reviewed and negative  Physical Exam   BP 133/83   Pulse 88   Temp 97.7 F (36.5 C) (Oral)   Resp 20   Ht 5\' 6"  (1.676 m)   Wt (!) 180.3 kg   LMP 01/04/2021   SpO2 100%   BMI 64.17 kg/m   Patient Vitals for the past 24 hrs:  BP Temp Temp src Pulse Resp SpO2 Height Weight  07/23/21 1630 133/83 -- -- 88 -- 100 % -- --  07/23/21 1615 138/79 -- -- 89 -- 100 % -- --  07/23/21 1600 (!) 149/81 -- -- 88 -- 99 % -- --  07/23/21 1545 (!) 150/81 -- -- 86 -- 97 % -- --  07/23/21 1530 (!) 156/79 -- -- 86 --  98 % -- --  07/23/21 1447 (!) 150/87 -- -- 89 -- -- -- --  07/23/21 1438 127/85 -- -- 91 -- -- -- --  07/23/21 1422 (!) 143/81 97.7 F (36.5 C) Oral 94 20 98 % 5\' 6"  (1.676 m) (!) 180.3 kg    Physical Exam Vitals and nursing note reviewed.  Constitutional:      Appearance: She is well-developed. She is obese. She is not ill-appearing or diaphoretic.  HENT:     Head: Normocephalic and atraumatic.  Cardiovascular:     Rate and Rhythm: Normal rate and regular rhythm.     Heart sounds: Normal heart sounds.  Pulmonary:     Effort: Pulmonary effort is normal. No respiratory distress.     Breath sounds: Normal breath sounds.  Musculoskeletal:     Right lower leg: No tenderness. Edema present.     Left lower leg: No tenderness. Edema present.  Skin:    General: Skin is warm and dry.  Neurological:     Mental Status: She is alert.     Deep Tendon Reflexes:     Reflex Scores:      Patellar reflexes are 2+ on the right side and 2+ on the left side.    Comments: No clonus  Psychiatric:        Mood and Affect: Mood normal.        Behavior: Behavior normal.    FHT Baseline 145, moderate variability, 10x10 accels, no decels Toco:  none Cat: 1  Labs Results for orders placed or performed during the hospital encounter of 07/23/21 (from the past 24 hour(s))  BLOOD TRANSFUSION REPORT - SCANNED     Status: None   Collection Time: 07/23/21 11:16 AM   Narrative   Ordered by an unspecified provider.  Protein / creatinine ratio, urine     Status: None   Collection Time: 07/23/21  3:05 PM  Result Value Ref Range   Creatinine, Urine 32.82 mg/dL   Total Protein, Urine <6 mg/dL   Protein Creatinine Ratio        0.00 - 0.15 mg/mg[Cre]  CBC     Status: Abnormal   Collection Time: 07/23/21  3:30 PM  Result Value Ref Range   WBC 14.0 (H) 4.0 - 10.5 K/uL   RBC 4.16 3.87 - 5.11 MIL/uL   Hemoglobin 12.3 12.0 - 15.0 g/dL   HCT 37.2 36.0 - 46.0 %   MCV 89.4 80.0 - 100.0 fL   MCH 29.6 26.0 - 34.0 pg   MCHC 33.1 30.0 - 36.0 g/dL   RDW 14.2 11.5 - 15.5 %   Platelets 289 150 - 400 K/uL   nRBC 0.0 0.0 - 0.2 %  Comprehensive metabolic panel     Status: Abnormal   Collection Time: 07/23/21  3:30 PM  Result Value Ref Range   Sodium 138 135 - 145 mmol/L   Potassium 4.4 3.5 - 5.1 mmol/L   Chloride 108 98 - 111 mmol/L   CO2 20 (L) 22 - 32 mmol/L   Glucose, Bld 74 70 - 99 mg/dL   BUN 5 (L) 6 - 20 mg/dL   Creatinine, Ser 0.60 0.44 - 1.00 mg/dL   Calcium 8.6 (L) 8.9 - 10.3 mg/dL   Total Protein 6.2 (L) 6.5 - 8.1 g/dL   Albumin 2.9 (L) 3.5 - 5.0 g/dL   AST 19 15 - 41 U/L   ALT 15 0 - 44 U/L   Alkaline Phosphatase 61 38 - 126 U/L  Total Bilirubin 0.6 0.3 - 1.2 mg/dL   GFR, Estimated >60 >60 mL/min   Anion gap 10 5 - 15  Troponin I (High Sensitivity)     Status: None   Collection Time: 07/23/21  3:30 PM  Result Value Ref Range   Troponin I (High Sensitivity) 4 <18 ng/L    Imaging No results found.  MAU Course  Procedures Lab Orders         CBC         Comprehensive metabolic panel         Protein / creatinine ratio, urine    No orders of the defined types were placed in this encounter.  Imaging Orders  No imaging  studies ordered today    MDM Patient discharged from [facility] yesterday after being admitted for gestational hypertension.  In MAU she has had some elevated BPs consistent with that diagnosis. None severe range. Preeclampsia labs normal.   Chest pain evaluated with labs & EKG. EKG normal. Troponin normal. Patient is no distress.   Reviewed prenatal records from [facility] & chart from recent hospitalization.  Assessment and Plan   1. Gestational hypertension without significant proteinuria during pregnancy in third trimester, antepartum  -Keep scheduled follow up appointment with [facility] next week -Reviewed preeclampsia precautions  2. Chest pain, unspecified type  -Return for worsening symptoms  3. [redacted] weeks gestation of pregnancy      Jorje Guild, NP 07/23/21 5:47 PM

## 2021-07-24 ENCOUNTER — Ambulatory Visit: Payer: No Typology Code available for payment source

## 2021-08-05 ENCOUNTER — Inpatient Hospital Stay (HOSPITAL_COMMUNITY)
Admission: AD | Admit: 2021-08-05 | Discharge: 2021-08-05 | Disposition: A | Discharge status: Home / Self Care | Payer: No Typology Code available for payment source | Attending: Obstetrics and Gynecology | Admitting: Obstetrics and Gynecology

## 2021-08-05 ENCOUNTER — Other Ambulatory Visit: Payer: Self-pay

## 2021-08-05 ENCOUNTER — Encounter (HOSPITAL_COMMUNITY): Payer: Self-pay | Admitting: Obstetrics and Gynecology

## 2021-08-05 DIAGNOSIS — Z3A30 30 weeks gestation of pregnancy: Secondary | ICD-10-CM | POA: Insufficient documentation

## 2021-08-05 DIAGNOSIS — O133 Gestational [pregnancy-induced] hypertension without significant proteinuria, third trimester: Secondary | ICD-10-CM | POA: Insufficient documentation

## 2021-08-05 LAB — COMPREHENSIVE METABOLIC PANEL
ALT: 12 U/L (ref 0–44)
AST: 14 U/L — ABNORMAL LOW (ref 15–41)
Albumin: 2.6 g/dL — ABNORMAL LOW (ref 3.5–5.0)
Alkaline Phosphatase: 68 U/L (ref 38–126)
Anion gap: 9 (ref 5–15)
BUN: 6 mg/dL (ref 6–20)
CO2: 20 mmol/L — ABNORMAL LOW (ref 22–32)
Calcium: 8.8 mg/dL — ABNORMAL LOW (ref 8.9–10.3)
Chloride: 108 mmol/L (ref 98–111)
Creatinine, Ser: 0.58 mg/dL (ref 0.44–1.00)
GFR, Estimated: >60 mL/min (ref >60)
Glucose, Bld: 86 mg/dL (ref 70–99)
Potassium: 3.8 mmol/L (ref 3.5–5.1)
Sodium: 137 mmol/L (ref 135–145)
Total Bilirubin: 0.5 mg/dL (ref 0.3–1.2)
Total Protein: 6 g/dL — ABNORMAL LOW (ref 6.5–8.1)

## 2021-08-05 LAB — PROTEIN / CREATININE RATIO, URINE
Creatinine, Urine: 68.06 mg/dL
Total Protein, Urine: <6 mg/dL

## 2021-08-05 LAB — CBC
HCT: 36.5 % (ref 36.0–46.0)
Hemoglobin: 11.9 g/dL — ABNORMAL LOW (ref 12.0–15.0)
MCH: 29.1 pg (ref 26.0–34.0)
MCHC: 32.6 g/dL (ref 30.0–36.0)
MCV: 89.2 fL (ref 80.0–100.0)
Platelets: 261 10*3/uL (ref 150–400)
RBC: 4.09 MIL/uL (ref 3.87–5.11)
RDW: 14 % (ref 11.5–15.5)
WBC: 12 10*3/uL — ABNORMAL HIGH (ref 4.0–10.5)
nRBC: 0 % (ref 0.0–0.2)

## 2021-08-05 MED ORDER — ACETAMINOPHEN-CAFFEINE 500-65 MG PO TABS
2.0000 | ORAL_TABLET | Freq: Once | ORAL | Status: AC
  Administered 2021-08-05: 2 via ORAL
  Filled 2021-08-05: qty 2

## 2021-08-05 NOTE — MAU Provider Note (Signed)
History     CSN: [id number]  Arrival date and time: 08/05/21 1512   Event Date/Time   First Provider Initiated Contact with Patient 08/05/21 1557      No chief complaint on file.  Kamdyn Ruddy is a 26 y.o. G1P0 at [redacted]w[redacted]d who presents today with a headache and elevated blood pressure. She states that at 1000 today she started to get a headache. She took 2ES Tylenol and Cyproheptadine, but she states that has not helped. She works at a [place] office so she took her blood pressure and it was 150s/80s. Patient has a known diagnosis of GHTN and she has a history of headaches/migraines. She has fioricet but she has not tried taking that. She denies any contractions, VB or  LOF. She reports fetal movement.    OB History     Gravida  1   Para      Term      Preterm      AB      Living         SAB      IAB      Ectopic      Multiple      Live Births              Past Medical History:  Diagnosis Date   Anxiety    Arthritis    Asthma    Depression    GERD (gastroesophageal reflux disease)    Headache    Pneumonia     Past Surgical History:  Procedure Laterality Date   CHOLECYSTECTOMY, LAPAROSCOPIC  100316   UPPER GASTROINTESTINAL ENDOSCOPY     WISDOM TOOTH EXTRACTION  [id number]   WRIST SURGERY  120115   WRIST SURGERY  08/2015    Family History  Problem Relation Age of Onset   Diabetes Mother    Hypertension Mother    Diabetes Father    Hypertension Father     Social History   Tobacco Use   Smoking status: Never   Smokeless tobacco: Never  Vaping Use   Vaping Use: Never used  Substance Use Topics   Alcohol use: No    Alcohol/week: 0.0 standard drinks   Drug use: No    Allergies:  Allergies  Allergen Reactions   Prednisone     Medications Prior to Admission  Medication Sig Dispense Refill Last Dose   Adalimumab (HUMIRA) 40 MG/0.4ML PSKT Inject 40 mg into the skin once a week.   Past Week   cyproheptadine (PERIACTIN) 4 MG tablet  Take 1 tablet (4 mg total) by mouth 3 (three) times daily as needed (headache). 90 tablet 3 08/05/2021   escitalopram (LEXAPRO) 20 MG tablet Take 20 mg by mouth daily.   08/05/2021   famotidine (PEPCID) 20 MG tablet Take 20 mg by mouth 2 (two) times daily.   08/04/2021   pantoprazole (PROTONIX) 40 MG tablet Take 1 tablet (40 mg total) by mouth at bedtime. 90 tablet 3 08/05/2021   Prenatal Vit-Fe Fumarate-FA (MULTIVITAMIN-PRENATAL) 27-0.8 MG TABS tablet Take 1 tablet by mouth daily at 12 noon.   08/05/2021   ranitidine (ZANTAC) 15 MG/ML syrup Take by mouth 2 (two) times daily.   08/05/2021   albuterol (VENTOLIN HFA) 108 (90 Base) MCG/ACT inhaler Inhale 2 puffs into the lungs every 4 (four) hours as needed for wheezing or shortness of breath.      butalbital-acetaminophen-caffeine (FIORICET) 50-325-40 MG tablet Take 2 tablets by mouth every 6 (six) hours as needed for  headache. 120 tablet 3    polyethylene glycol (MIRALAX / GLYCOLAX) 17 g packet Take 17 g by mouth daily.       Review of Systems  All other systems reviewed and are negative. Physical Exam   Blood pressure 137/70, pulse 98, temperature 98.4 F (36.9 C), temperature source Oral, resp. rate 18, height 5\' 6"  (1.676 m), weight (!) 180.3 kg, last menstrual period 01/04/2021, SpO2 98 %.  Physical Exam Vitals and nursing note reviewed. Exam conducted with a chaperone present.  Constitutional:      Appearance: She is well-developed.  HENT:     Head: Normocephalic.  Eyes:     Pupils: Pupils are equal, round, and reactive to light.  Cardiovascular:     Rate and Rhythm: Normal rate and regular rhythm.     Heart sounds: Normal heart sounds.  Pulmonary:     Effort: Pulmonary effort is normal. No respiratory distress.     Breath sounds: Normal breath sounds.  Abdominal:     Palpations: Abdomen is soft.     Tenderness: There is no abdominal tenderness.  Genitourinary:    Vagina: No bleeding. Vaginal discharge: mucusy.    Comments: External: no  lesion Vagina: small amount of white discharge     Musculoskeletal:        General: Normal range of motion.     Cervical back: Normal range of motion and neck supple.  Skin:    General: Skin is warm and dry.  Neurological:     Mental Status: She is alert and oriented to person, place, and time.  Psychiatric:        Mood and Affect: Mood normal.        Behavior: Behavior normal.    NST:  Baseline: 142 Variability: moderate Accels: 10x10 Decels: none Toco: none Reactive/Appropriate for GA   Results for orders placed or performed during the hospital encounter of 08/05/21 (from the past 24 hour(s))  Protein / creatinine ratio, urine     Status: None   Collection Time: 08/05/21  3:58 PM  Result Value Ref Range   Creatinine, Urine 68.06 mg/dL   Total Protein, Urine <6 mg/dL   Protein Creatinine Ratio        0.00 - 0.15 mg/mg[Cre]  CBC     Status: Abnormal   Collection Time: 08/05/21  4:26 PM  Result Value Ref Range   WBC 12.0 (H) 4.0 - 10.5 K/uL   RBC 4.09 3.87 - 5.11 MIL/uL   Hemoglobin 11.9 (L) 12.0 - 15.0 g/dL   HCT 36.5 36.0 - 46.0 %   MCV 89.2 80.0 - 100.0 fL   MCH 29.1 26.0 - 34.0 pg   MCHC 32.6 30.0 - 36.0 g/dL   RDW 14.0 11.5 - 15.5 %   Platelets 261 150 - 400 K/uL   nRBC 0.0 0.0 - 0.2 %  Comprehensive metabolic panel     Status: Abnormal   Collection Time: 08/05/21  4:26 PM  Result Value Ref Range   Sodium 137 135 - 145 mmol/L   Potassium 3.8 3.5 - 5.1 mmol/L   Chloride 108 98 - 111 mmol/L   CO2 20 (L) 22 - 32 mmol/L   Glucose, Bld 86 70 - 99 mg/dL   BUN 6 6 - 20 mg/dL   Creatinine, Ser 0.58 0.44 - 1.00 mg/dL   Calcium 8.8 (L) 8.9 - 10.3 mg/dL   Total Protein 6.0 (L) 6.5 - 8.1 g/dL   Albumin 2.6 (L) 3.5 - 5.0 g/dL  AST 14 (L) 15 - 41 U/L   ALT 12 0 - 44 U/L   Alkaline Phosphatase 68 38 - 126 U/L   Total Bilirubin 0.5 0.3 - 1.2 mg/dL   GFR, Estimated >60 >60 mL/min   Anion gap 9 5 - 15    Patient Vitals for the past 24 hrs:  BP Temp Temp src  Pulse Resp SpO2 Height Weight  08/05/21 1746 140/64 -- -- 82 -- 98 % -- --  08/05/21 1741 -- -- -- -- -- 99 % -- --  08/05/21 1736 -- -- -- -- -- 99 % -- --  08/05/21 1731 (!) 140/59 -- -- 93 -- 98 % -- --  08/05/21 1726 -- -- -- -- -- 98 % -- --  08/05/21 1721 -- -- -- -- -- 98 % -- --  08/05/21 1717 121/69 -- -- 88 -- -- -- --  08/05/21 1716 -- -- -- -- -- 98 % -- --  08/05/21 1711 -- -- -- -- -- 99 % -- --  08/05/21 1705 -- -- -- -- -- 99 % -- --  08/05/21 1701 (!) 152/79 -- -- 88 -- 99 % -- --  08/05/21 1656 -- -- -- -- -- 99 % -- --  08/05/21 1651 -- -- -- -- -- 99 % -- --  08/05/21 1646 137/78 -- -- 93 -- 98 % -- --  08/05/21 1641 -- -- -- -- -- 99 % -- --  08/05/21 1636 -- -- -- -- -- 99 % -- --  08/05/21 1631 134/72 -- -- 91 -- 99 % -- --  08/05/21 1626 -- -- -- -- -- 99 % -- --  08/05/21 1621 -- -- -- -- -- 99 % -- --  08/05/21 1616 128/61 -- -- 93 -- 99 % -- --  08/05/21 1606 -- -- -- -- -- 98 % 5\' 6"  (1.676 m) (!) 180.3 kg  08/05/21 1601 137/70 -- -- 98 -- 99 % -- --  08/05/21 1556 129/74 -- -- 95 -- 98 % -- --  08/05/21 1551 -- -- -- -- -- 98 % -- --  08/05/21 1546 -- -- -- -- -- 99 % -- --  08/05/21 1543 130/72 98.4 F (36.9 C) Oral (!) 101 18 99 % -- --     MAU Course  Procedures  MDM Patient given tylenol and caffeine PO. She reports her headache has improved.   Patient still with labile BP and occasional readings in the [id number], and all diastolic readings below 90. Will continue to treat as GHTN, but unclear if this isn't really CHTN... however, no s/s of pre-ecalmpsia at this time. So will continue to monitor as planned outpatient.   Assessment and Plan   1. Gestational hypertension, third trimester   2. [redacted] weeks gestation of pregnancy    DC home in stable condition  3rd Trimester precautions  Pre-eclampsia warning signs  PTL precautions  Fetal kick counts RX: no new RX  Return to MAU as needed FU with OB as planned   Follow-up Information      [facility]ne[facility], [place] Follow up.   Specialty: Obstetrics and Gynecology Contact information: [place] [address] [phone]                Marcille Buffy DNP, CNM  08/05/21  6:14 PM

## 2021-08-05 NOTE — MAU Note (Signed)
.  Tiffany Miles is a 26 y.o. at [redacted]w[redacted]d here in MAU reporting: headache since 10 am today. Pt works in an [employer] and they checked her b/p there and it was running 150's/90's. Tried 1000 mg of tylenol without relief. Reports decreased fetal movement today. Also reports some lower abd pressure.  Onset of complaint: 1000 Pain score: 6/10 headache Vitals:   08/05/21 1543  BP: 130/72  Pulse: (!) 101  Resp: 18  Temp: 98.4 F (36.9 C)  SpO2: 99%     FHT:142 Lab orders placed from triage:

## 2021-08-06 ENCOUNTER — Other Ambulatory Visit: Payer: Self-pay | Admitting: *Deleted

## 2021-08-06 DIAGNOSIS — O99213 Obesity complicating pregnancy, third trimester: Secondary | ICD-10-CM

## 2021-08-08 ENCOUNTER — Ambulatory Visit: Payer: No Typology Code available for payment source | Attending: Obstetrics and Gynecology

## 2021-08-08 ENCOUNTER — Ambulatory Visit: Payer: No Typology Code available for payment source | Admitting: *Deleted

## 2021-08-08 VITALS — BP 132/67 | HR 90

## 2021-08-08 DIAGNOSIS — Z3A30 30 weeks gestation of pregnancy: Secondary | ICD-10-CM | POA: Diagnosis not present

## 2021-08-08 DIAGNOSIS — O99213 Obesity complicating pregnancy, third trimester: Secondary | ICD-10-CM | POA: Diagnosis not present

## 2021-08-08 DIAGNOSIS — O133 Gestational [pregnancy-induced] hypertension without significant proteinuria, third trimester: Secondary | ICD-10-CM | POA: Diagnosis present

## 2021-08-12 ENCOUNTER — Other Ambulatory Visit: Payer: Self-pay | Admitting: *Deleted

## 2021-08-12 DIAGNOSIS — O99213 Obesity complicating pregnancy, third trimester: Secondary | ICD-10-CM

## 2021-08-12 DIAGNOSIS — O133 Gestational [pregnancy-induced] hypertension without significant proteinuria, third trimester: Secondary | ICD-10-CM

## 2021-08-13 ENCOUNTER — Other Ambulatory Visit: Payer: Self-pay | Admitting: *Deleted

## 2021-08-13 ENCOUNTER — Ambulatory Visit: Payer: No Typology Code available for payment source | Attending: Obstetrics and Gynecology

## 2021-08-13 ENCOUNTER — Ambulatory Visit: Payer: No Typology Code available for payment source | Admitting: *Deleted

## 2021-08-13 DIAGNOSIS — Z3A31 31 weeks gestation of pregnancy: Secondary | ICD-10-CM

## 2021-08-13 DIAGNOSIS — O133 Gestational [pregnancy-induced] hypertension without significant proteinuria, third trimester: Secondary | ICD-10-CM

## 2021-08-13 DIAGNOSIS — O99213 Obesity complicating pregnancy, third trimester: Secondary | ICD-10-CM

## 2021-08-13 DIAGNOSIS — E669 Obesity, unspecified: Secondary | ICD-10-CM

## 2021-08-13 DIAGNOSIS — R638 Other symptoms and signs concerning food and fluid intake: Secondary | ICD-10-CM

## 2021-08-14 ENCOUNTER — Ambulatory Visit: Payer: No Typology Code available for payment source

## 2021-08-14 ENCOUNTER — Other Ambulatory Visit: Payer: No Typology Code available for payment source

## 2021-08-20 ENCOUNTER — Ambulatory Visit: Payer: Medicaid Other | Attending: Obstetrics and Gynecology

## 2021-08-20 ENCOUNTER — Ambulatory Visit: Payer: Medicaid Other | Admitting: *Deleted

## 2021-08-20 VITALS — BP 136/70 | HR 93

## 2021-08-20 DIAGNOSIS — O133 Gestational [pregnancy-induced] hypertension without significant proteinuria, third trimester: Secondary | ICD-10-CM | POA: Diagnosis present

## 2021-08-20 DIAGNOSIS — O99213 Obesity complicating pregnancy, third trimester: Secondary | ICD-10-CM

## 2021-08-20 DIAGNOSIS — R638 Other symptoms and signs concerning food and fluid intake: Secondary | ICD-10-CM | POA: Diagnosis present

## 2021-08-20 DIAGNOSIS — Z3A32 32 weeks gestation of pregnancy: Secondary | ICD-10-CM

## 2021-08-25 ENCOUNTER — Encounter (HOSPITAL_COMMUNITY): Payer: Self-pay | Admitting: Obstetrics and Gynecology

## 2021-08-25 ENCOUNTER — Inpatient Hospital Stay (HOSPITAL_COMMUNITY)
Admission: AD | Admit: 2021-08-25 | Discharge: 2021-08-25 | Disposition: A | Discharge status: Home / Self Care | Payer: No Typology Code available for payment source | Attending: Obstetrics and Gynecology | Admitting: Obstetrics and Gynecology

## 2021-08-25 DIAGNOSIS — G43909 Migraine, unspecified, not intractable, without status migrainosus: Secondary | ICD-10-CM | POA: Diagnosis not present

## 2021-08-25 DIAGNOSIS — Z3A33 33 weeks gestation of pregnancy: Secondary | ICD-10-CM | POA: Insufficient documentation

## 2021-08-25 DIAGNOSIS — O99353 Diseases of the nervous system complicating pregnancy, third trimester: Secondary | ICD-10-CM | POA: Insufficient documentation

## 2021-08-25 DIAGNOSIS — O133 Gestational [pregnancy-induced] hypertension without significant proteinuria, third trimester: Secondary | ICD-10-CM

## 2021-08-25 LAB — CBC
HCT: 35.7 % — ABNORMAL LOW (ref 36.0–46.0)
Hemoglobin: 12.1 g/dL (ref 12.0–15.0)
MCH: 29.4 pg (ref 26.0–34.0)
MCHC: 33.9 g/dL (ref 30.0–36.0)
MCV: 86.7 fL (ref 80.0–100.0)
Platelets: 271 10*3/uL (ref 150–400)
RBC: 4.12 MIL/uL (ref 3.87–5.11)
RDW: 13.5 % (ref 11.5–15.5)
WBC: 11.8 10*3/uL — ABNORMAL HIGH (ref 4.0–10.5)
nRBC: 0 % (ref 0.0–0.2)

## 2021-08-25 LAB — COMPREHENSIVE METABOLIC PANEL
ALT: 13 U/L (ref 0–44)
AST: 15 U/L (ref 15–41)
Albumin: 2.4 g/dL — ABNORMAL LOW (ref 3.5–5.0)
Alkaline Phosphatase: 84 U/L (ref 38–126)
Anion gap: 10 (ref 5–15)
BUN: 6 mg/dL (ref 6–20)
CO2: 20 mmol/L — ABNORMAL LOW (ref 22–32)
Calcium: 8.5 mg/dL — ABNORMAL LOW (ref 8.9–10.3)
Chloride: 106 mmol/L (ref 98–111)
Creatinine, Ser: 0.66 mg/dL (ref 0.44–1.00)
GFR, Estimated: >60 mL/min (ref >60)
Glucose, Bld: 95 mg/dL (ref 70–99)
Potassium: 4 mmol/L (ref 3.5–5.1)
Sodium: 136 mmol/L (ref 135–145)
Total Bilirubin: 0.4 mg/dL (ref 0.3–1.2)
Total Protein: 5.6 g/dL — ABNORMAL LOW (ref 6.5–8.1)

## 2021-08-25 LAB — PROTEIN / CREATININE RATIO, URINE
Creatinine, Urine: 245.93 mg/dL
Protein Creatinine Ratio: 0.04 mg/mg{Cre} (ref 0.00–0.15)
Total Protein, Urine: 10 mg/dL

## 2021-08-25 MED ORDER — DIPHENHYDRAMINE HCL 25 MG PO CAPS
25.0000 mg | ORAL_CAPSULE | Freq: Once | ORAL | Status: AC
Start: 2021-08-25 — End: 2021-08-25
  Administered 2021-08-25: 25 mg via ORAL
  Filled 2021-08-25: qty 1

## 2021-08-25 MED ORDER — CAFFEINE 200 MG PO TABS
200.0000 mg | ORAL_TABLET | Freq: Once | ORAL | Status: AC
  Administered 2021-08-25: 200 mg via ORAL
  Filled 2021-08-25: qty 1

## 2021-08-25 MED ORDER — METOCLOPRAMIDE HCL 10 MG PO TABS
10.0000 mg | ORAL_TABLET | Freq: Once | ORAL | Status: AC
  Administered 2021-08-25: 10 mg via ORAL
  Filled 2021-08-25: qty 1

## 2021-08-29 ENCOUNTER — Encounter (HOSPITAL_COMMUNITY): Payer: Self-pay | Admitting: Obstetrics and Gynecology

## 2021-08-29 ENCOUNTER — Ambulatory Visit: Payer: No Typology Code available for payment source | Admitting: *Deleted

## 2021-08-29 ENCOUNTER — Other Ambulatory Visit: Payer: Self-pay | Admitting: Obstetrics and Gynecology

## 2021-08-29 ENCOUNTER — Ambulatory Visit (HOSPITAL_BASED_OUTPATIENT_CLINIC_OR_DEPARTMENT_OTHER): Payer: No Typology Code available for payment source

## 2021-08-29 ENCOUNTER — Inpatient Hospital Stay (HOSPITAL_COMMUNITY)
Admission: AD | Admit: 2021-08-29 | Discharge: 2021-08-29 | Disposition: A | Discharge status: Home / Self Care | Payer: No Typology Code available for payment source | Attending: Obstetrics and Gynecology | Admitting: Obstetrics and Gynecology

## 2021-08-29 ENCOUNTER — Other Ambulatory Visit: Payer: Self-pay

## 2021-08-29 VITALS — BP 130/70 | HR 98

## 2021-08-29 DIAGNOSIS — O99213 Obesity complicating pregnancy, third trimester: Secondary | ICD-10-CM | POA: Insufficient documentation

## 2021-08-29 DIAGNOSIS — O133 Gestational [pregnancy-induced] hypertension without significant proteinuria, third trimester: Secondary | ICD-10-CM

## 2021-08-29 DIAGNOSIS — Z3A33 33 weeks gestation of pregnancy: Secondary | ICD-10-CM | POA: Insufficient documentation

## 2021-08-29 DIAGNOSIS — R519 Headache, unspecified: Secondary | ICD-10-CM | POA: Diagnosis not present

## 2021-08-29 DIAGNOSIS — Z362 Encounter for other antenatal screening follow-up: Secondary | ICD-10-CM | POA: Diagnosis not present

## 2021-08-29 DIAGNOSIS — R200 Anesthesia of skin: Secondary | ICD-10-CM | POA: Insufficient documentation

## 2021-08-29 DIAGNOSIS — Z8249 Family history of ischemic heart disease and other diseases of the circulatory system: Secondary | ICD-10-CM | POA: Diagnosis not present

## 2021-08-29 DIAGNOSIS — O09893 Supervision of other high risk pregnancies, third trimester: Secondary | ICD-10-CM | POA: Insufficient documentation

## 2021-08-29 DIAGNOSIS — Z3689 Encounter for other specified antenatal screening: Secondary | ICD-10-CM | POA: Diagnosis not present

## 2021-08-29 DIAGNOSIS — O283 Abnormal ultrasonic finding on antenatal screening of mother: Secondary | ICD-10-CM

## 2021-08-29 DIAGNOSIS — Z79899 Other long term (current) drug therapy: Secondary | ICD-10-CM | POA: Insufficient documentation

## 2021-08-29 DIAGNOSIS — O99353 Diseases of the nervous system complicating pregnancy, third trimester: Secondary | ICD-10-CM | POA: Diagnosis not present

## 2021-08-29 LAB — COMPREHENSIVE METABOLIC PANEL
ALT: 11 U/L (ref 0–44)
AST: 15 U/L (ref 15–41)
Albumin: 2.4 g/dL — ABNORMAL LOW (ref 3.5–5.0)
Alkaline Phosphatase: 88 U/L (ref 38–126)
Anion gap: 12 (ref 5–15)
BUN: 6 mg/dL (ref 6–20)
CO2: 19 mmol/L — ABNORMAL LOW (ref 22–32)
Calcium: 8.8 mg/dL — ABNORMAL LOW (ref 8.9–10.3)
Chloride: 107 mmol/L (ref 98–111)
Creatinine, Ser: 0.61 mg/dL (ref 0.44–1.00)
GFR, Estimated: >60 mL/min (ref >60)
Glucose, Bld: 81 mg/dL (ref 70–99)
Potassium: 4 mmol/L (ref 3.5–5.1)
Sodium: 138 mmol/L (ref 135–145)
Total Bilirubin: 0.4 mg/dL (ref 0.3–1.2)
Total Protein: 5.6 g/dL — ABNORMAL LOW (ref 6.5–8.1)

## 2021-08-29 LAB — CBC
HCT: 35.4 % — ABNORMAL LOW (ref 36.0–46.0)
Hemoglobin: 11.7 g/dL — ABNORMAL LOW (ref 12.0–15.0)
MCH: 28.5 pg (ref 26.0–34.0)
MCHC: 33.1 g/dL (ref 30.0–36.0)
MCV: 86.1 fL (ref 80.0–100.0)
Platelets: 276 10*3/uL (ref 150–400)
RBC: 4.11 MIL/uL (ref 3.87–5.11)
RDW: 13.4 % (ref 11.5–15.5)
WBC: 9.7 10*3/uL (ref 4.0–10.5)
nRBC: 0 % (ref 0.0–0.2)

## 2021-08-29 LAB — PROTEIN / CREATININE RATIO, URINE
Creatinine, Urine: 129.91 mg/dL
Protein Creatinine Ratio: 0.05 mg/mg{Cre} (ref 0.00–0.15)
Total Protein, Urine: 7 mg/dL

## 2021-08-29 NOTE — Procedures (Signed)
Meyer Dockery May 01, 1995 [redacted]w[redacted]d  Fetus A Non-Stress Test Interpretation for 08/29/21  Indication: Unsatisfactory BPP  Fetal Heart Rate A Mode: External Baseline Rate (A): 130 bpm Variability: Moderate Accelerations: 15 x 15 Decelerations: None Multiple birth?: No  Uterine Activity Mode: Palpation, Toco Contraction Frequency (min): none Resting Tone Palpated: Relaxed  Interpretation (Fetal Testing) Nonstress Test Interpretation: Reactive Comments: Dr. Judeth Cornfield reviewd tracing.

## 2021-08-29 NOTE — MAU Provider Note (Signed)
History     CSN: [id number]  Arrival date and time: 08/29/21 1016   Event Date/Time   First Provider Initiated Contact with Patient 08/29/21 1111      Chief Complaint  Patient presents with   BP Evaluation   HPI [name] is a 26 y.o. year old G1P0 female at [redacted]w[redacted]d weeks gestation who presents to MAU reporting increased BP on her wrist cuff this AM (155/84), numbness and tingling in RT hand and she woke up with a H/A this morning (rated 3/10). She reports being dx'd with gHTN 1.5 months ago. She was seen in MAU on 08/28/2021 for the same complaints. She has a recent negative MRI. Dr. Adrian Blackwater consulted a neurologist on her during her last MAU visit who thought the H/A is migraine related and not at risk for seizures. Her H/A was treated with Benadryl, Reglan and Caffeine; which resolved the H/A. Her high risk pregnancy is complicated by morbid obesity, gHTN and risk for PTB. She receives [facility] with [facility]; next appt is 7/6 or 7/7. She has an appointment with MFM today at 1515.    OB History     Gravida  1   Para      Term      Preterm      AB      Living         SAB      IAB      Ectopic      Multiple      Live Births              Past Medical History:  Diagnosis Date   Anxiety    Arthritis    Asthma    Depression    GERD (gastroesophageal reflux disease)    Headache    Pneumonia     Past Surgical History:  Procedure Laterality Date   CHOLECYSTECTOMY, LAPAROSCOPIC  100316   UPPER GASTROINTESTINAL ENDOSCOPY     WISDOM TOOTH EXTRACTION  [id number]   WRIST SURGERY  [phone]   WRIST SURGERY  08/2015    Family History  Problem Relation Age of Onset   Diabetes Mother    Hypertension Mother    Diabetes Father    Hypertension Father    Kidney disease Maternal Grandmother    Hypertension Maternal Grandmother    Stroke Paternal Grandmother    Hypertension Paternal Grandmother    Diabetes Paternal Grandmother    Asthma Neg Hx    Cancer Neg  Hx    Heart disease Neg Hx     Social History   Tobacco Use   Smoking status: Never   Smokeless tobacco: Never  Vaping Use   Vaping Use: Never used  Substance Use Topics   Alcohol use: No    Alcohol/week: 0.0 standard drinks of alcohol   Drug use: No    Allergies:  Allergies  Allergen Reactions   Prednisone     Medications Prior to Admission  Medication Sig Dispense Refill Last Dose   Adalimumab (HUMIRA) 40 MG/0.4ML PSKT Inject 40 mg into the skin once a week.   Past Week   albuterol (VENTOLIN HFA) 108 (90 Base) MCG/ACT inhaler Inhale 2 puffs into the lungs every 4 (four) hours as needed for wheezing or shortness of breath.   Past Week   cyproheptadine (PERIACTIN) 4 MG tablet Take 1 tablet (4 mg total) by mouth 3 (three) times daily as needed (headache). 90 tablet 3 08/29/2021   escitalopram (LEXAPRO) 20  MG tablet Take 20 mg by mouth daily.   08/29/2021   pantoprazole (PROTONIX) 40 MG tablet Take 1 tablet (40 mg total) by mouth at bedtime. 90 tablet 3 08/29/2021   Prenatal Vit-Fe Fumarate-FA (MULTIVITAMIN-PRENATAL) 27-0.8 MG TABS tablet Take 1 tablet by mouth daily at 12 noon.   08/29/2021   ranitidine (ZANTAC) 15 MG/ML syrup Take by mouth 2 (two) times daily.   08/29/2021   acetaminophen (TYLENOL) 500 MG tablet Take 1,000 mg by mouth every 6 (six) hours as needed.      butalbital-acetaminophen-caffeine (FIORICET) 50-325-40 MG tablet Take 2 tablets by mouth every 6 (six) hours as needed for headache. 120 tablet 3 More than a month   famotidine (PEPCID) 20 MG tablet Take 20 mg by mouth 2 (two) times daily.      polyethylene glycol (MIRALAX / GLYCOLAX) 17 g packet Take 17 g by mouth daily.       Review of Systems  Constitutional: Negative.   HENT: Negative.    Eyes: Negative.   Respiratory: Negative.    Cardiovascular:        Elevated BP pf 155/84  Gastrointestinal: Negative.   Endocrine: Negative.   Genitourinary: Negative.   Musculoskeletal: Negative.   Skin: Negative.    Allergic/Immunologic: Negative.   Neurological:  Positive for numbness (in RT hand) and headaches (mild, "tolerable").  Hematological: Negative.   Psychiatric/Behavioral: Negative.      Physical Exam   Patient Vitals for the past 24 hrs:  BP Temp Temp src Pulse Resp SpO2 Height Weight  08/29/21 1331 (!) 133/46 -- -- 74 -- -- -- --  08/29/21 1315 139/71 -- -- 75 -- 100 % -- --  08/29/21 1300 127/72 -- -- 70 -- 100 % -- --  08/29/21 1245 132/80 -- -- 75 -- 100 % -- --  08/29/21 1230 137/83 -- -- 76 -- 100 % -- --  08/29/21 1215 (!) 151/80 -- -- 75 -- 100 % -- --  08/29/21 1200 (!) 146/84 -- -- 76 -- 100 % -- --  08/29/21 1145 (!) 149/80 -- -- 76 -- 99 % -- --  08/29/21 1130 128/64 -- -- 86 -- 99 % -- --  08/29/21 1115 133/73 -- -- 82 -- 100 % -- --  08/29/21 1105 131/68 -- -- 79 -- -- -- --  08/29/21 1035 140/88 98.5 F (36.9 C) Oral 76 19 99 % -- --  08/29/21 1028 -- -- -- -- -- -- 5\' 6"  (1.676 m) (!) 185.5 kg     Physical Exam Vitals and nursing note reviewed. Exam conducted with a chaperone present.  Constitutional:      Appearance: Normal appearance. She is obese.  HENT:     Head: Normocephalic and atraumatic.  Eyes:     Extraocular Movements: Extraocular movements intact.     Pupils: Pupils are equal, round, and reactive to light.  Cardiovascular:     Rate and Rhythm: Normal rate.  Pulmonary:     Effort: Pulmonary effort is normal.  Abdominal:     Palpations: Abdomen is soft.  Genitourinary:    Comments: Not indicated Musculoskeletal:        General: Normal range of motion.  Skin:    General: Skin is warm and dry.  Neurological:     Mental Status: She is alert and oriented to person, place, and time.  Psychiatric:        Mood and Affect: Mood normal.        Behavior: Behavior normal.  Thought Content: Thought content normal.        Judgment: Judgment normal.    REACTIVE NST - FHR: 130 bpm / moderate variability / accels present / decels absent /  TOCO: irregular UC's in earlier tracing, UI noted prior to d/c  MAU Course  Procedures  MDM CCUA CBC CMP P/C Ratio Serial BP's   Results for orders placed or performed during the hospital encounter of 08/29/21 (from the past 24 hour(s))  CBC     Status: Abnormal   Collection Time: 08/29/21 10:57 AM  Result Value Ref Range   WBC 9.7 4.0 - 10.5 K/uL   RBC 4.11 3.87 - 5.11 MIL/uL   Hemoglobin 11.7 (L) 12.0 - 15.0 g/dL   HCT 54.6 (L) 56.8 - 12.7 %   MCV 86.1 80.0 - 100.0 fL   MCH 28.5 26.0 - 34.0 pg   MCHC 33.1 30.0 - 36.0 g/dL   RDW 51.7 00.1 - 74.9 %   Platelets 276 150 - 400 K/uL   nRBC 0.0 0.0 - 0.2 %  Comprehensive metabolic panel     Status: Abnormal   Collection Time: 08/29/21 10:57 AM  Result Value Ref Range   Sodium 138 135 - 145 mmol/L   Potassium 4.0 3.5 - 5.1 mmol/L   Chloride 107 98 - 111 mmol/L   CO2 19 (L) 22 - 32 mmol/L   Glucose, Bld 81 70 - 99 mg/dL   BUN 6 6 - 20 mg/dL   Creatinine, Ser 4.49 0.44 - 1.00 mg/dL   Calcium 8.8 (L) 8.9 - 10.3 mg/dL   Total Protein 5.6 (L) 6.5 - 8.1 g/dL   Albumin 2.4 (L) 3.5 - 5.0 g/dL   AST 15 15 - 41 U/L   ALT 11 0 - 44 U/L   Alkaline Phosphatase 88 38 - 126 U/L   Total Bilirubin 0.4 0.3 - 1.2 mg/dL   GFR, Estimated >67 >59 mL/min   Anion gap 12 5 - 15  Protein / creatinine ratio, urine     Status: None   Collection Time: 08/29/21 11:23 AM  Result Value Ref Range   Creatinine, Urine 129.91 mg/dL   Total Protein, Urine 7 mg/dL   Protein Creatinine Ratio 0.05 0.00 - 0.15 mg/mg[Cre]      Assessment and Plan  Gestational hypertension, third trimester - Information provided on PEC - Advised to stop using wrist cuff at home d/t higher chances of falsely high BP readings.   [redacted] weeks gestation of pregnancy   - Discharge patient - Keep scheduled appt with [facility] on 7/6 or 7/7 - Patient verbalized an understanding of the plan of care and agrees.    Raelyn Mora, CNM 08/29/2021, 11:11 AM

## 2021-08-29 NOTE — MAU Note (Signed)
Jessi Pitstick is a 26 y.o. at [redacted]w[redacted]d here in MAU reporting: had elevated BP's @ home this morning and also has numbness and tingling in right hand.  Reports BP 155/84. Reports was diagnosed with GHTN approximately 1.5 months ago.  Reports currently has a H/A, woke up with H/A, hasn't taken any meds to treat.  Denies visual disturbances, endorses intermittent epigastric "cramping".  States had epigastric "cramping approx 20 minutes ago, but not currently.  Denies VB or LOF.  Endorses +FM.  Onset of complaint: today Pain score: 3/10 There were no vitals filed for this visit.   FHT:124 bpm Lab orders placed from triage:  UA

## 2021-09-04 ENCOUNTER — Ambulatory Visit: Payer: No Typology Code available for payment source | Admitting: *Deleted

## 2021-09-04 ENCOUNTER — Ambulatory Visit: Payer: No Typology Code available for payment source | Attending: Obstetrics and Gynecology

## 2021-09-04 VITALS — BP 127/75 | HR 92

## 2021-09-04 DIAGNOSIS — E669 Obesity, unspecified: Secondary | ICD-10-CM | POA: Diagnosis not present

## 2021-09-04 DIAGNOSIS — O133 Gestational [pregnancy-induced] hypertension without significant proteinuria, third trimester: Secondary | ICD-10-CM

## 2021-09-04 DIAGNOSIS — Z3A34 34 weeks gestation of pregnancy: Secondary | ICD-10-CM | POA: Diagnosis not present

## 2021-09-04 DIAGNOSIS — O99213 Obesity complicating pregnancy, third trimester: Secondary | ICD-10-CM | POA: Diagnosis not present

## 2021-09-05 ENCOUNTER — Ambulatory Visit: Payer: No Typology Code available for payment source

## 2021-09-05 LAB — OB RESULTS CONSOLE GBS: GBS: POSITIVE

## 2021-09-12 ENCOUNTER — Ambulatory Visit: Payer: No Typology Code available for payment source | Attending: Obstetrics and Gynecology

## 2021-09-12 ENCOUNTER — Ambulatory Visit: Payer: No Typology Code available for payment source

## 2021-09-12 ENCOUNTER — Ambulatory Visit: Payer: No Typology Code available for payment source | Admitting: *Deleted

## 2021-09-12 VITALS — BP 131/80 | HR 102

## 2021-09-12 DIAGNOSIS — O133 Gestational [pregnancy-induced] hypertension without significant proteinuria, third trimester: Secondary | ICD-10-CM

## 2021-09-12 DIAGNOSIS — Z3A35 35 weeks gestation of pregnancy: Secondary | ICD-10-CM | POA: Diagnosis not present

## 2021-09-12 DIAGNOSIS — O99213 Obesity complicating pregnancy, third trimester: Secondary | ICD-10-CM | POA: Diagnosis not present

## 2021-09-16 ENCOUNTER — Other Ambulatory Visit: Payer: Self-pay | Admitting: *Deleted

## 2021-09-16 DIAGNOSIS — O133 Gestational [pregnancy-induced] hypertension without significant proteinuria, third trimester: Secondary | ICD-10-CM

## 2021-09-16 DIAGNOSIS — O99213 Obesity complicating pregnancy, third trimester: Secondary | ICD-10-CM

## 2021-09-17 ENCOUNTER — Ambulatory Visit: Payer: No Typology Code available for payment source | Admitting: *Deleted

## 2021-09-17 ENCOUNTER — Ambulatory Visit (HOSPITAL_BASED_OUTPATIENT_CLINIC_OR_DEPARTMENT_OTHER): Payer: No Typology Code available for payment source | Admitting: *Deleted

## 2021-09-17 ENCOUNTER — Encounter (HOSPITAL_COMMUNITY): Payer: Self-pay

## 2021-09-17 ENCOUNTER — Other Ambulatory Visit: Payer: Self-pay | Admitting: Obstetrics and Gynecology

## 2021-09-17 ENCOUNTER — Ambulatory Visit: Payer: No Typology Code available for payment source | Attending: Obstetrics and Gynecology

## 2021-09-17 ENCOUNTER — Telehealth (HOSPITAL_COMMUNITY): Payer: Self-pay | Admitting: *Deleted

## 2021-09-17 DIAGNOSIS — O133 Gestational [pregnancy-induced] hypertension without significant proteinuria, third trimester: Secondary | ICD-10-CM | POA: Diagnosis present

## 2021-09-17 DIAGNOSIS — O99213 Obesity complicating pregnancy, third trimester: Secondary | ICD-10-CM

## 2021-09-17 DIAGNOSIS — Z3A36 36 weeks gestation of pregnancy: Secondary | ICD-10-CM | POA: Diagnosis not present

## 2021-09-17 DIAGNOSIS — Z6841 Body Mass Index (BMI) 40.0 and over, adult: Secondary | ICD-10-CM | POA: Diagnosis present

## 2021-09-17 NOTE — Procedures (Signed)
Tiffany Miles [birth year] [redacted]w[redacted]d  Fetus A Non-Stress Test Interpretation for 09/17/21  Indication:  gest HTN & Morbidly obese  Fetal Heart Rate A Mode: External Baseline Rate (A): 140 bpm Variability: Moderate Accelerations: 15 x 15 Decelerations: None Multiple birth?: No  Uterine Activity Mode: Toco Contraction Frequency (min): none Resting Tone Palpated: Relaxed  Interpretation (Fetal Testing) Nonstress Test Interpretation: Reactive Overall Impression: Reassuring for gestational age Comments: tracing reviewed by Dr. Grace Bushy

## 2021-09-17 NOTE — H&P (Signed)
HPI: 26 y.o. G1P0 @ [redacted]w[redacted]d estimated gestational age (as dated by LMP c/w 11 week ultrasound) presents for induction of labor for gestational HTN.  Leakage of fluid:  No Vaginal bleeding:  No Contractions:  No Fetal movement:  Yes  Prenatal care has been provided by Dr. Steva Ready [facility])  ROS:  Denies fevers, chills, chest pain, visual changes, SOB, RUQ/epigastric pain, N/V, dysuria, hematuria, or sudden onset/worsening bilateral LE or facial edema.  Pregnancy complicated by: Gestational HTN (diagnosed at approximately 28 weeks, Following with MFM) Migraines with visual disturbances (Follows with [facility], unremarkable MRI head/brain on 5/23, Fioricet PRN, Cyproheptadine 4mg  TID PRN PRN) Psoriatic arthritis (Follows with [facility], Humira weekly) Obesity (BMI 62) Rh negative (Rhogam received on 5/22) Depression (Lexapro 10mg  daily) Rubella non-immune GERD (Protonix 40mg  daily)  Prenatal Transfer Tool  Maternal Diabetes: No Genetic Screening: Normal Maternal Ultrasounds/Referrals: Normal Fetal Ultrasounds or other Referrals:  Referred to Materal Fetal Medicine  Maternal Substance Abuse:  No Significant Maternal Medications:  Meds include: Other: Fioricet, Cyproheptadine, Humira, Lexapro, Protonix Significant Maternal Lab Results: Group B Strep positive   Prenatal Labs Blood type:  O Negative Antibody screen:  Negative CBC:  H/H 12.0/35.8 Rubella: Immune RPR:  Non-reactive Hep B:  Negative Hep C:  Negative HIV:  Negative GC/CT:  Negative Glucola:  137.4 (elevated)  3h GTT normal  Immunizations: Tdap: Given prenatally Flu: Received last flu season  OBHx:  OB History     Gravida  1   Para      Term      Preterm      AB      Living         SAB      IAB      Ectopic      Multiple      Live Births             PMHx:  See above Meds:  PNV, Fioricet PRN, Cyproheptadine 4mg  TID PRN, Lexapro 10mg  daily, Protonix 40mg   daily Allergy:   Allergies  Allergen Reactions   Prednisone    SurgHx: Cholecystectomy SocHx:   Denies Tobacco, ETOH, illicit drugs  O: LMP 01/04/2021  Gen. AAOx3, NAD CV.  RRR  Resp. CTAB, no wheezes/rales/rhonchi Abd. Gravid, soft, non-tender throughout, no rebound/guarding Extr.  1+ bilateral LE edema, no calf tenderness bilaterally SVE: 2/40/high, cephalic by sutures    Last growth :   Narrative & Impression  ----------------------------------------------------------------------  OBSTETRICS REPORT                       (Signed Final 09/17/2021 10:42 am) ---------------------------------------------------------------------- Patient Info  ID #:                                D.O.B.:  03-Mar-1995 (25 yrs)  Name:                      Visit Date: 09/17/2021 09:00 am ---------------------------------------------------------------------- Performed By  Attending:        [place]      Ref. Address:     09/19/2021                    MD  [address]                                                             [address]                                                             [place], [place]                                                             [address]  Performed By:     Earley Brooke     Location:         [facility]                    BS, RDMS                                 [facility]  Referred By:      Steva Ready ---------------------------------------------------------------------- Orders  #  Description                           Code        Ordered By  1  [place] MFM FETAL BPP                      99242.6     RAVI [facility]     W/NONSTRESS  2  [place] MFM OB FOLLOW UP                   83419.62    RAVI  [facility] ----------------------------------------------------------------------  #  Order #                     Accession #                Episode #  1  [id number]                   [id number]  [id number]  2  [id number]                   [id number]                 [id number] ---------------------------------------------------------------------- Indications  Maternal morbid obesity (PreG BMI: 62)         O99.210 E66.01  [redacted] weeks gestation of pregnancy                Z3A.36  Hypertension - Gestational (No meds)           O13.9  LR NIPS ---------------------------------------------------------------------- Fetal Evaluation  Num Of Fetuses:         1  Fetal Heart Rate(bpm):  147  Cardiac Activity:       Observed  Presentation:           Cephalic  Placenta:               Anterior  P. Cord Insertion:      Previously Visualized  Amniotic Fluid  AFI FV:      Within normal limits  AFI Sum(cm)     %Tile       Largest Pocket(cm)  18.23           69          5.96  RUQ(cm)       RLQ(cm)       LUQ(cm)        LLQ(cm)  3.77          5.96          4.41           4.09 ---------------------------------------------------------------------- Biophysical Evaluation  Amniotic F.V:   Within normal limits       F. Tone:        Observed  F. Movement:    Not Observed               N.S.T:          Reactive  F. Breathing:   Observed                   Score:          8/10 ---------------------------------------------------------------------- Biometry  BPD:     86.55  mm     G. Age:  34w 6d         18  %    CI:         75.8   %    70 - 86                                                          FL/HC:      21.4   %    20.8 - 22.6  HC:    315.16   mm     G. Age:  35w 3d          6  %    HC/AC:      0.96        0.92 - 1.05  AC:    327.81   mm     G. Age:  36w 5d         66  %    FL/BPD:     78.0   %  71 - 87  FL:      67.47  mm     G. Age:  34w 5d          9  %    FL/AC:      20.6   %    20 - 24  LV:         7.3  mm  Est. FW:    2783  gm      6 lb 2 oz     34  % ---------------------------------------------------------------------- OB History  Gravidity:    1         Term:   0        Prem:   0        SAB:   0  TOP:          0       Ectopic:  0        Living: 0 ---------------------------------------------------------------------- Gestational Age  LMP:           36w 4d        Date:  01/04/21                  EDD:   10/11/21  U/S Today:     35w 3d                                        EDD:   10/19/21  Best:          36w 4d     Det. By:  LMP  (01/04/21)          EDD:   10/11/21 ---------------------------------------------------------------------- Impression  Follow up growth due to elevated BMI  Normal interval growth with measurements consistent with  dates  Good fetal movement and amniotic fluid volume  Biophysical profile 8/10 ---------------------------------------------------------------------- Recommendations  She is scheduled for delivery at 37 weeks. ----------------------------------------------------------------------              Lin Landsman, MD Electronically Signed Final Report   09/17/2021 10:42 am ----------------------------------------------------------------------    Labs: see orders  A/P:  26 y.o. G1P0 @ [redacted]w[redacted]d who presents for induction of labor for gestational HTN.  - Admit to L&D - Admit labs (CBC, T&S, COVID screen) - CEFM/Toco - Diet:  Clear liquids - IVF:  LR at 125cc/hour - VTE Prophylaxis:  SCDs - GBS Status:  Positive - PCN ordered - Presentation:  Confirm prior to IOL - Pain control:  Per patient request - Induction method:  Cytotec for cervical ripening - Anticipate SVD  Steva Ready, DO

## 2021-09-17 NOTE — Telephone Encounter (Signed)
Preadmission screen  

## 2021-09-18 ENCOUNTER — Encounter (HOSPITAL_COMMUNITY): Payer: Self-pay | Admitting: *Deleted

## 2021-09-18 ENCOUNTER — Telehealth (HOSPITAL_COMMUNITY): Payer: Self-pay | Admitting: *Deleted

## 2021-09-18 NOTE — Progress Notes (Signed)
Referring:  Gordy Councilman, MD [address] [place],  [place] [address]  PCP: Pcp, No  Neurology was asked to evaluate Tiffany Miles, a 26 year old female for a chief complaint of headaches.  Our recommendations of care will be communicated by shared medical record.    CC:  headaches  History provided from self  HPI:  Medical co-morbidities: gestational HTN, psoriatic arthritis, anxiety/depression, asthma  The patient presents for evaluation of headaches which began 10 years ago, but have increased in frequency recently. She is currently pregnant and is scheduled for induction in 3 days. Currently she has 1-2 migraines per month which can last 2-3 days at a time. They are described as temporal throbbing with associated visual aura, facial droop, and weakness on one side. They can last 2-3 days at a time. Currently she takes Tylenol and cyproheptadine as needed which help a little.  Headache History: Onset: 10 years ago Triggers: no Aura: visual aura, facial droop, weakness Location: temple Quality/Description: throbbing Associated Symptoms:  Photophobia: yes  Phonophobia: yes  Nausea: yes Vomiting: no Worse with activity?: yes Duration of headaches: 2-3 days  Headache days per month: 2 Headache free days per month: 28  Current Treatment: Abortive Tylenol  Preventative none  Prior Therapies                                 Fioricet - helped Imitrex - helped Topamax - paresthesias Lexapro Cyproheptadine 4 mg TID PRN - helped   LABS: CBC    Component Value Date/Time   WBC 9.7 08/29/2021 1057   RBC 4.11 08/29/2021 1057   HGB 11.7 (L) 08/29/2021 1057   HCT 35.4 (L) 08/29/2021 1057   PLT 276 08/29/2021 1057   MCV 86.1 08/29/2021 1057   MCH 28.5 08/29/2021 1057   MCHC 33.1 08/29/2021 1057   RDW 13.4 08/29/2021 1057   LYMPHSABS 3.7 06/27/2021 1932   MONOABS 0.9 06/27/2021 1932   EOSABS 0.2 06/27/2021 1932   BASOSABS 0.0 06/27/2021 1932       Latest Ref Rng & Units 08/29/2021   10:57 AM 08/25/2021    4:33 PM 08/05/2021    4:26 PM  CMP  Glucose 70 - 99 mg/dL 81  95  86   BUN 6 - 20 mg/dL 6  6  6    Creatinine 0.44 - 1.00 mg/dL  3.33  5.45   Sodium 135 - 145 mmol/L 138  136  137   Potassium 3.5 - 5.1 mmol/L 4.0  4.0  3.8   Chloride 98 - 111 mmol/L 107  106  108   CO2 22 - 32 mmol/L 19  20  20    Calcium 8.9 - 10.3 mg/dL 8.8  8.5  8.8   Total Protein 6.5 - 8.1 g/dL 5.6  5.6  6.0   Total Bilirubin 0.3 - 1.2 mg/dL 0.4  0.4  0.5   Alkaline Phos 38 - 126 U/L 88  84  68   AST 15 - 41 U/L 15  15  14    ALT 0 - 44 U/L 11  13  12       IMAGING:  MRI/MRV brain 07/22/21: no acute process  Imaging independently reviewed on September 19, 2021   Current Outpatient Medications on File Prior to Visit  Medication Sig Dispense Refill   acetaminophen (TYLENOL) 500 MG tablet Take 1,000 mg by mouth every 6 (six) hours  as needed.     Adalimumab (HUMIRA) 40 MG/0.4ML PSKT Inject 40 mg into the skin once a week.     albuterol (VENTOLIN HFA) 108 (90 Base) MCG/ACT inhaler Inhale 2 puffs into the lungs every 4 (four) hours as needed for wheezing or shortness of breath.     butalbital-acetaminophen-caffeine (FIORICET) 50-325-40 MG tablet Take 2 tablets by mouth every 6 (six) hours as needed for headache. 120 tablet 3   cyproheptadine (PERIACTIN) 4 MG tablet Take 1 tablet (4 mg total) by mouth 3 (three) times daily as needed (headache). 90 tablet 3   docusate sodium (COLACE) 100 MG capsule Take 100 mg by mouth as needed for mild constipation.     escitalopram (LEXAPRO) 20 MG tablet Take 20 mg by mouth daily.     pantoprazole (PROTONIX) 40 MG tablet Take 1 tablet (40 mg total) by mouth at bedtime. 90 tablet 3   polyethylene glycol (MIRALAX / GLYCOLAX) 17 g packet Take 17 g by mouth as needed.     Prenatal Vit-Fe Fumarate-FA (MULTIVITAMIN-PRENATAL) 27-0.8 MG TABS tablet Take 1 tablet by mouth daily at 12 noon.     ranitidine (ZANTAC) 15 MG/ML syrup Take by  mouth 2 (two) times daily.     No current facility-administered medications on file prior to visit.     Allergies: Allergies  Allergen Reactions   Prednisone Other (See Comments)    Causes migraines    Family History: Migraine or other headaches in the family:  half sister Aneurysms in a first degree relative:  no Brain tumors in the family:  no Other neurological illness in the family:   no  Past Medical History: Past Medical History:  Diagnosis Date   Anxiety    Arthritis    Asthma    Depression    GERD (gastroesophageal reflux disease)    Headache    Migraine    Pneumonia    Pregnancy induced hypertension     Past Surgical History Past Surgical History:  Procedure Laterality Date   CHOLECYSTECTOMY, LAPAROSCOPIC  100316   UPPER GASTROINTESTINAL ENDOSCOPY     WISDOM TOOTH EXTRACTION  [id number]   WRIST SURGERY  120115   WRIST SURGERY  08/2015    Social History: Social History   Tobacco Use   Smoking status: Never   Smokeless tobacco: Never  Vaping Use   Vaping Use: Never used  Substance Use Topics   Alcohol use: No    Alcohol/week: 0.0 standard drinks of alcohol   Drug use: No     ROS: Negative for fevers, chills. Positive for headaches. All other systems reviewed and negative unless stated otherwise in HPI.   Physical Exam:   Vital Signs: BP 136/79   Pulse 93   Ht 5\' 6"  (1.676 m)   Wt (!) 420 lb (190.5 kg)   LMP 01/04/2021   BMI 67.79 kg/m  GENERAL: well appearing,in no acute distress,alert SKIN:  Color, texture, turgor normal. No rashes or lesions HEAD:  Normocephalic/atraumatic. CV:  RRR RESP: Normal respiratory effort MSK: no tenderness to palpation over occiput, neck, or shoulders  NEUROLOGICAL: Mental Status: Alert, oriented to person, place and time,Follows commands Cranial Nerves: PERRL, visual fields intact to confrontation, extraocular movements intact, facial sensation intact, no facial droop or ptosis, hearing grossly intact,  no dysarthria Motor: muscle strength 5/5 both upper and lower extremities,no drift, normal tone Reflexes: 2+ throughout Sensation: intact to light touch all 4 extremities Coordination: Finger-to- nose-finger intact bilaterally Gait: normal-based   IMPRESSION: 25  year old female with a history of gestational HTN, psoriatic arthritis, anxiety/depression, asthma who presents for evaluation of migraines. She is currently pregnant and is planned for induction in 3 days. She does plan on breastfeeding. Discussed treatment options in breastfeeding including supplements and as-needed medications. 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 Will start diclofenac for rescue. Counseled to avoid using this until after she has delivered her baby. Advised her to avoid cyproheptadine with breastfeeding. May consider gepant for rescue once she has completed breastfeeding.  PLAN: -Rescue: Start diclofenac 50 mg PRN. Counseled to avoid starting this until after she has given birth -Supplement information for migraine prevention provided (magnesium, B2) -Counseled to avoid cyproheptadine while breastfeeding  I spent a total of 21 minutes chart reviewing and counseling the patient. Headache education was done. Discussed treatment options including acute medications, natural supplements, and physical therapy. Discussed medication side effects, adverse reactions and drug interactions. Written educational materials and patient instructions outlining all of the above were given.  Follow-up: 6 months   Ocie Doyne, MD 09/19/2021   8:36 AM

## 2021-09-18 NOTE — Telephone Encounter (Signed)
Preadmission screen  

## 2021-09-19 ENCOUNTER — Other Ambulatory Visit: Payer: Self-pay | Admitting: Psychiatry

## 2021-09-19 ENCOUNTER — Ambulatory Visit: Payer: No Typology Code available for payment source

## 2021-09-19 ENCOUNTER — Ambulatory Visit (INDEPENDENT_AMBULATORY_CARE_PROVIDER_SITE_OTHER): Payer: No Typology Code available for payment source | Admitting: Psychiatry

## 2021-09-19 ENCOUNTER — Other Ambulatory Visit: Payer: No Typology Code available for payment source

## 2021-09-19 ENCOUNTER — Encounter: Payer: Self-pay | Admitting: Psychiatry

## 2021-09-19 VITALS — BP 136/79 | HR 93 | Ht 66.0 in | Wt >= 6400 oz

## 2021-09-19 DIAGNOSIS — G43409 Hemiplegic migraine, not intractable, without status migrainosus: Secondary | ICD-10-CM | POA: Diagnosis not present

## 2021-09-19 MED ORDER — DICLOFENAC POTASSIUM 50 MG PO TABS
50.0000 mg | ORAL_TABLET | ORAL | 6 refills | Status: DC | PRN
Start: 2021-09-19 — End: 2021-09-22

## 2021-09-19 NOTE — Patient Instructions (Addendum)
Start diclofenac as needed for migraine headaches. Do not start this until after you have given birth Please do not take cyproheptadine while breastfeeding  Migraine and Breastfeeding:  Data is limiting regarding the safety of migraine medications during breastfeeding. These are some of the medications which appear to be safe during lactation.  Rescue Medications  -NSAIDs - Ibuprofen is the preferred NSAID to use while breastfeeding but other NSAIDs can be used as well -Tylenol  Preventive Medications and supplements -Magnesium oxide 400 mg daily  -Riboflavin 200 mg twice a day -Verapamil -Gabapentin - monitor for drowsiness -Propranolol, labetalol - monitor for low heart rate, low blood sugar  Non-medication options: -Physical therapy for the neck -Occipital nerve blocks -Acupuncture -Biofeedback/Relaxation techniques

## 2021-09-21 ENCOUNTER — Other Ambulatory Visit: Payer: Self-pay

## 2021-09-22 ENCOUNTER — Other Ambulatory Visit: Payer: Self-pay | Admitting: Psychiatry

## 2021-09-22 ENCOUNTER — Inpatient Hospital Stay (HOSPITAL_COMMUNITY)
Admission: AD | Admit: 2021-09-22 | Discharge: 2021-09-25 | DRG: 806 | Disposition: A | Discharge status: Home / Self Care | Payer: Medicaid Other | Attending: Obstetrics and Gynecology | Admitting: Obstetrics and Gynecology

## 2021-09-22 ENCOUNTER — Encounter (HOSPITAL_COMMUNITY): Payer: Self-pay | Admitting: Anesthesiology

## 2021-09-22 ENCOUNTER — Inpatient Hospital Stay (HOSPITAL_COMMUNITY): Payer: Medicaid Other | Admitting: Anesthesiology

## 2021-09-22 ENCOUNTER — Telehealth: Payer: Self-pay | Admitting: Psychiatry

## 2021-09-22 ENCOUNTER — Encounter (HOSPITAL_COMMUNITY): Payer: Self-pay | Admitting: Obstetrics and Gynecology

## 2021-09-22 ENCOUNTER — Inpatient Hospital Stay (HOSPITAL_COMMUNITY)
Admission: RE | Admit: 2021-09-22 | Payer: Medicaid Other | Source: Ambulatory Visit | Admitting: Obstetrics and Gynecology

## 2021-09-22 DIAGNOSIS — O99214 Obesity complicating childbirth: Secondary | ICD-10-CM | POA: Diagnosis present

## 2021-09-22 DIAGNOSIS — O99824 Streptococcus B carrier state complicating childbirth: Secondary | ICD-10-CM | POA: Diagnosis present

## 2021-09-22 DIAGNOSIS — O9962 Diseases of the digestive system complicating childbirth: Secondary | ICD-10-CM | POA: Diagnosis present

## 2021-09-22 DIAGNOSIS — O9972 Diseases of the skin and subcutaneous tissue complicating childbirth: Secondary | ICD-10-CM | POA: Diagnosis present

## 2021-09-22 DIAGNOSIS — K219 Gastro-esophageal reflux disease without esophagitis: Secondary | ICD-10-CM | POA: Diagnosis present

## 2021-09-22 DIAGNOSIS — Z6791 Unspecified blood type, Rh negative: Secondary | ICD-10-CM | POA: Diagnosis not present

## 2021-09-22 DIAGNOSIS — O99354 Diseases of the nervous system complicating childbirth: Secondary | ICD-10-CM | POA: Diagnosis present

## 2021-09-22 DIAGNOSIS — Z3A37 37 weeks gestation of pregnancy: Secondary | ICD-10-CM | POA: Diagnosis not present

## 2021-09-22 DIAGNOSIS — O133 Gestational [pregnancy-induced] hypertension without significant proteinuria, third trimester: Secondary | ICD-10-CM

## 2021-09-22 DIAGNOSIS — F32A Depression, unspecified: Secondary | ICD-10-CM | POA: Diagnosis present

## 2021-09-22 DIAGNOSIS — O134 Gestational [pregnancy-induced] hypertension without significant proteinuria, complicating childbirth: Principal | ICD-10-CM | POA: Diagnosis present

## 2021-09-22 DIAGNOSIS — O99344 Other mental disorders complicating childbirth: Secondary | ICD-10-CM | POA: Diagnosis present

## 2021-09-22 DIAGNOSIS — L4050 Arthropathic psoriasis, unspecified: Secondary | ICD-10-CM | POA: Diagnosis present

## 2021-09-22 DIAGNOSIS — G43909 Migraine, unspecified, not intractable, without status migrainosus: Secondary | ICD-10-CM | POA: Diagnosis present

## 2021-09-22 DIAGNOSIS — O139 Gestational [pregnancy-induced] hypertension without significant proteinuria, unspecified trimester: Principal | ICD-10-CM | POA: Diagnosis present

## 2021-09-22 DIAGNOSIS — O26893 Other specified pregnancy related conditions, third trimester: Secondary | ICD-10-CM | POA: Diagnosis present

## 2021-09-22 LAB — COMPREHENSIVE METABOLIC PANEL
ALT: 12 U/L (ref 0–44)
AST: 16 U/L (ref 15–41)
Albumin: 2.2 g/dL — ABNORMAL LOW (ref 3.5–5.0)
Alkaline Phosphatase: 104 U/L (ref 38–126)
Anion gap: 7 (ref 5–15)
BUN: 8 mg/dL (ref 6–20)
CO2: 22 mmol/L (ref 22–32)
Calcium: 8.8 mg/dL — ABNORMAL LOW (ref 8.9–10.3)
Chloride: 109 mmol/L (ref 98–111)
Creatinine, Ser: 0.77 mg/dL (ref 0.44–1.00)
GFR, Estimated: >60 mL/min (ref >60)
Glucose, Bld: 106 mg/dL — ABNORMAL HIGH (ref 70–99)
Potassium: 3.6 mmol/L (ref 3.5–5.1)
Sodium: 138 mmol/L (ref 135–145)
Total Bilirubin: 0.3 mg/dL (ref 0.3–1.2)
Total Protein: 5.3 g/dL — ABNORMAL LOW (ref 6.5–8.1)

## 2021-09-22 LAB — CBC
HCT: 36.5 % (ref 36.0–46.0)
HCT: 37.4 % (ref 36.0–46.0)
Hemoglobin: 11.9 g/dL — ABNORMAL LOW (ref 12.0–15.0)
Hemoglobin: 12.4 g/dL (ref 12.0–15.0)
MCH: 28.1 pg (ref 26.0–34.0)
MCH: 28.3 pg (ref 26.0–34.0)
MCHC: 32.6 g/dL (ref 30.0–36.0)
MCHC: 33.2 g/dL (ref 30.0–36.0)
MCV: 86.3 fL (ref 80.0–100.0)
MCV: 85.4 fL (ref 80.0–100.0)
Platelets: 263 10*3/uL (ref 150–400)
Platelets: 257 10*3/uL (ref 150–400)
RBC: 4.23 MIL/uL (ref 3.87–5.11)
RBC: 4.38 MIL/uL (ref 3.87–5.11)
RDW: 14.6 % (ref 11.5–15.5)
RDW: 14.5 % (ref 11.5–15.5)
WBC: 11 10*3/uL — ABNORMAL HIGH (ref 4.0–10.5)
WBC: 13.5 10*3/uL — ABNORMAL HIGH (ref 4.0–10.5)
nRBC: 0 % (ref 0.0–0.2)
nRBC: 0 % (ref 0.0–0.2)

## 2021-09-22 LAB — TYPE AND SCREEN
ABO/RH(D): O NEG
Antibody Screen: NEGATIVE

## 2021-09-22 LAB — RPR: RPR Ser Ql: NONREACTIVE

## 2021-09-22 LAB — PROTEIN / CREATININE RATIO, URINE
Creatinine, Urine: 219 mg/dL
Protein Creatinine Ratio: 0.04 mg/mg{Cre} (ref 0.00–0.15)
Total Protein, Urine: 8 mg/dL

## 2021-09-22 LAB — LACTATE DEHYDROGENASE: LDH: 154 U/L (ref 98–192)

## 2021-09-22 MED ORDER — FENTANYL-BUPIVACAINE-NACL 0.5-0.125-0.9 MG/250ML-% EP SOLN
12.0000 mL/h | EPIDURAL | Status: DC | PRN
  Administered 2021-09-22: 12 mL/h via EPIDURAL
  Filled 2021-09-22: qty 250

## 2021-09-22 MED ORDER — OXYCODONE-ACETAMINOPHEN 5-325 MG PO TABS: 1.0000 | ORAL_TABLET | ORAL | Status: DC | PRN

## 2021-09-22 MED ORDER — LACTATED RINGERS IV SOLN: INTRAVENOUS | Status: DC

## 2021-09-22 MED ORDER — OXYCODONE-ACETAMINOPHEN 5-325 MG PO TABS: 2.0000 | ORAL_TABLET | ORAL | Status: DC | PRN

## 2021-09-22 MED ORDER — SOD CITRATE-CITRIC ACID 500-334 MG/5ML PO SOLN: 30.0000 mL | ORAL | Status: DC | PRN

## 2021-09-22 MED ORDER — ACETAMINOPHEN 325 MG PO TABS
650.0000 mg | ORAL_TABLET | ORAL | Status: DC | PRN
  Administered 2021-09-22 – 2021-09-23 (×3): 650 mg via ORAL
  Filled 2021-09-22 (×4): qty 2

## 2021-09-22 MED ORDER — LABETALOL HCL 5 MG/ML IV SOLN: 40.0000 mg | INTRAVENOUS | Status: DC | PRN

## 2021-09-22 MED ORDER — TERBUTALINE SULFATE 1 MG/ML IJ SOLN: 0.2500 mg | Freq: Once | INTRAMUSCULAR | Status: DC | PRN

## 2021-09-22 MED ORDER — DICLOFENAC SODIUM 50 MG PO TBEC: 50.0000 mg | DELAYED_RELEASE_TABLET | Freq: Every day | ORAL | 6 refills | Status: AC | PRN

## 2021-09-22 MED ORDER — HYDROXYZINE HCL 50 MG PO TABS: 50.0000 mg | ORAL_TABLET | Freq: Four times a day (QID) | ORAL | Status: DC | PRN

## 2021-09-22 MED ORDER — OXYTOCIN-SODIUM CHLORIDE 30-0.9 UT/500ML-% IV SOLN
1.0000 m[IU]/min | INTRAVENOUS | Status: DC
  Administered 2021-09-22 – 2021-09-23 (×2): 2 m[IU]/min via INTRAVENOUS

## 2021-09-22 MED ORDER — FENTANYL CITRATE (PF) 100 MCG/2ML IJ SOLN
50.0000 ug | INTRAMUSCULAR | Status: DC | PRN
  Administered 2021-09-22 (×2): 50 ug via INTRAVENOUS
  Administered 2021-09-22 (×2): 100 ug via INTRAVENOUS
  Filled 2021-09-22 (×4): qty 2

## 2021-09-22 MED ORDER — PENICILLIN G POT IN DEXTROSE 60000 UNIT/ML IV SOLN
3.0000 10*6.[IU] | INTRAVENOUS | Status: DC
  Administered 2021-09-22 – 2021-09-23 (×8): 3 10*6.[IU] via INTRAVENOUS
  Filled 2021-09-22 (×8): qty 50

## 2021-09-22 MED ORDER — EPHEDRINE 5 MG/ML INJ: 10.0000 mg | INTRAVENOUS | Status: DC | PRN

## 2021-09-22 MED ORDER — ESCITALOPRAM OXALATE 10 MG PO TABS
10.0000 mg | ORAL_TABLET | Freq: Every day | ORAL | Status: DC
  Administered 2021-09-22 – 2021-09-25 (×3): 10 mg via ORAL
  Filled 2021-09-22 (×6): qty 1

## 2021-09-22 MED ORDER — SODIUM CHLORIDE 0.9 % IV SOLN
5.0000 10*6.[IU] | Freq: Once | INTRAVENOUS | Status: AC
  Administered 2021-09-22: 5 10*6.[IU] via INTRAVENOUS
  Filled 2021-09-22: qty 5

## 2021-09-22 MED ORDER — OXYTOCIN-SODIUM CHLORIDE 30-0.9 UT/500ML-% IV SOLN
2.5000 [IU]/h | INTRAVENOUS | Status: DC
  Administered 2021-09-23: 2.5 [IU]/h via INTRAVENOUS
  Filled 2021-09-22 (×2): qty 500

## 2021-09-22 MED ORDER — LIDOCAINE HCL (PF) 1 % IJ SOLN
30.0000 mL | INTRAMUSCULAR | Status: AC | PRN
  Administered 2021-09-23: 30 mL via SUBCUTANEOUS
  Filled 2021-09-22: qty 30

## 2021-09-22 MED ORDER — LACTATED RINGERS IV SOLN
500.0000 mL | Freq: Once | INTRAVENOUS | Status: AC
  Administered 2021-09-22: 500 mL via INTRAVENOUS

## 2021-09-22 MED ORDER — OXYTOCIN BOLUS FROM INFUSION
333.0000 mL | Freq: Once | INTRAVENOUS | Status: AC
  Administered 2021-09-23: 333 mL via INTRAVENOUS

## 2021-09-22 MED ORDER — PHENYLEPHRINE 80 MCG/ML (10ML) SYRINGE FOR IV PUSH (FOR BLOOD PRESSURE SUPPORT)
80.0000 ug | PREFILLED_SYRINGE | INTRAVENOUS | Status: DC | PRN
  Filled 2021-09-22: qty 10

## 2021-09-22 MED ORDER — ONDANSETRON HCL 4 MG/2ML IJ SOLN
4.0000 mg | Freq: Four times a day (QID) | INTRAMUSCULAR | Status: DC | PRN
  Administered 2021-09-22 – 2021-09-23 (×2): 4 mg via INTRAVENOUS
  Filled 2021-09-22 (×2): qty 2

## 2021-09-22 MED ORDER — LACTATED RINGERS IV SOLN
500.0000 mL | INTRAVENOUS | Status: DC | PRN
  Administered 2021-09-22 – 2021-09-23 (×2): 500 mL via INTRAVENOUS

## 2021-09-22 MED ORDER — LABETALOL HCL 5 MG/ML IV SOLN: 80.0000 mg | INTRAVENOUS | Status: DC | PRN

## 2021-09-22 MED ORDER — HYDRALAZINE HCL 20 MG/ML IJ SOLN: 10.0000 mg | INTRAMUSCULAR | Status: DC | PRN

## 2021-09-22 MED ORDER — PANTOPRAZOLE SODIUM 40 MG PO TBEC
40.0000 mg | DELAYED_RELEASE_TABLET | Freq: Every day | ORAL | Status: DC
  Administered 2021-09-22 – 2021-09-25 (×3): 40 mg via ORAL
  Filled 2021-09-22 (×4): qty 1

## 2021-09-22 MED ORDER — DIPHENHYDRAMINE HCL 50 MG/ML IJ SOLN: 12.5000 mg | INTRAMUSCULAR | Status: DC | PRN

## 2021-09-22 MED ORDER — MISOPROSTOL 25 MCG QUARTER TABLET
25.0000 ug | ORAL_TABLET | ORAL | Status: DC | PRN
  Administered 2021-09-22 (×3): 25 ug via VAGINAL
  Filled 2021-09-22 (×3): qty 1

## 2021-09-22 MED ORDER — PHENYLEPHRINE 80 MCG/ML (10ML) SYRINGE FOR IV PUSH (FOR BLOOD PRESSURE SUPPORT): 80.0000 ug | PREFILLED_SYRINGE | INTRAVENOUS | Status: DC | PRN

## 2021-09-22 MED ORDER — LABETALOL HCL 5 MG/ML IV SOLN
20.0000 mg | INTRAVENOUS | Status: DC | PRN
  Administered 2021-09-22: 20 mg via INTRAVENOUS
  Filled 2021-09-22: qty 4

## 2021-09-22 NOTE — Progress Notes (Signed)
OB Progress Note  S: Patient resting comfortably. Feeling some mild cramping with contractions. Consents to AROM and IUPC placement.  O: BP 130/79   Pulse 79   Temp 97.8 F (36.6 C) (Oral)   Resp 18   Ht 5\' 6"  (1.676 m)   Wt (!) 189.7 kg   LMP 01/04/2021   BMI 67.52 kg/m   FHT: 135bpm, moderate variablity, + accels, - decels Toco: not graphing well SVE: 3.5/50/-3, sutures palpated, IUPC placed after AROM AROM: Clear, non-odorous  A/P: 26 y.o. G1P0 @ [redacted]w[redacted]d admitted for induction of labor for gestational HTN.  FWB: Cat. I Labor course: S/p Cytotec vaginally x 3 doses, Pitocin at 3mU/min Pain: Per patient request GBS: Positive - receiving PCN Anticipate SVD  9m, DO

## 2021-09-22 NOTE — Progress Notes (Signed)
OB Progress Note  S: Patient is in bed and feeling significant pain with contractions. Just received Fentanyl and is contemplating epidural.  O: BP 139/76   Pulse 80   Temp 97.9 F (36.6 C) (Oral)   Resp 18   Ht 5\' 6"  (1.676 m)   Wt (!) 189.7 kg   LMP 01/04/2021   BMI 67.52 kg/m   FHT: 125bpm, moderate variablity, + accels, - decels Toco: q1-2 minutes, MVUs 185-200 SVE: deferred now, 4/80/-2 by RN at 1953   A/P: 26 y.o. G1P0 @ [redacted]w[redacted]d admitted for induction of labor for gestational HTN.  FWB: Cat. I Labor course: S/p Cytotec vaginally x 3 doses, Pitocin at 68mU/min, FSE placed by RN at 1953, IUPC in place, AROM at 1724 Pain: Per patient request GBS: Positive - receiving PCN Anticipate SVD  18m, DO

## 2021-09-22 NOTE — Telephone Encounter (Signed)
New rx for diclofenac sodium sent, thanks

## 2021-09-22 NOTE — Anesthesia Preprocedure Evaluation (Deleted)
Anesthesia Evaluation    Airway        Dental   Pulmonary asthma , pneumonia, resolved,           Cardiovascular hypertension, Pt. on medications      Neuro/Psych  Headaches, PSYCHIATRIC DISORDERS Anxiety Depression MRI/MRV brain 07/22/21: no acute process Has had some weakness with migraines concerning for possible hemiplegic migraines. To start Diclofenac as rescue Rx after delivery    GI/Hepatic Neg liver ROS, GERD  Medicated,  Endo/Other  Morbid obesity  Renal/GU negative Renal ROS  negative genitourinary   Musculoskeletal  (+) Arthritis , Psoriatic arthritis on Humira   Abdominal   Peds  Hematology negative hematology ROS (+)   Anesthesia Other Findings   Reproductive/Obstetrics (+) Pregnancy Gestational HTN                           Anesthesia Physical Anesthesia Plan  ASA: 3  Anesthesia Plan: Epidural   Post-op Pain Management: Minimal or no pain anticipated   Induction:   PONV Risk Score and Plan:   Airway Management Planned: Natural Airway  Additional Equipment:   Intra-op Plan:   Post-operative Plan:   Informed Consent: I have reviewed the patients History and Physical, chart, labs and discussed the procedure including the risks, benefits and alternatives for the proposed anesthesia with the patient or authorized representative who has indicated his/her understanding and acceptance.       Plan Discussed with: Anesthesiologist  Anesthesia Plan Comments:         Anesthesia Quick Evaluation

## 2021-09-22 NOTE — Progress Notes (Signed)
OB Progress Note  S: Patient resting comfortably. Felt some cramping earlier.   O: BP 136/72   Pulse 89   Temp 98.7 F (37.1 C) (Oral)   Resp 16   Ht 5\' 6"  (1.676 m)   Wt (!) 189.7 kg   LMP 01/04/2021   BMI 67.52 kg/m   FHT: 135bpm, moderate variablity, + accels, - decels Toco: none graphing SVE: deferred, 2/50/-3 at 0530 by primary RN  A/P: 26 y.o. G1P0 @ [redacted]w[redacted]d admitted for induction of labor for gestational HTN.  FWB: Cat. I Labor course: S/p Cytotec vaginally x 2 doses Pain: Per patient request GBS: Positive - s/p two doses of PCN Anticipate SVD  Okay to have light laboring diet for breakfast as she is in the early phase of induction of labor.  [redacted]w[redacted]d, DO

## 2021-09-22 NOTE — Anesthesia Preprocedure Evaluation (Signed)
Anesthesia Evaluation    Airway Mallampati: II  TM Distance: >3 FB Neck ROM: Full    Dental no notable dental hx.    Pulmonary asthma , pneumonia, resolved,    Pulmonary exam normal breath sounds clear to auscultation       Cardiovascular hypertension, Pt. on medications Normal cardiovascular exam Rhythm:Regular Rate:Normal     Neuro/Psych  Headaches, PSYCHIATRIC DISORDERS Anxiety Depression MRI/MRV brain 07/22/21: no acute process Has had some weakness with migraines concerning for possible hemiplegic migraines. To start Diclofenac as rescue Rx after delivery    GI/Hepatic Neg liver ROS, GERD  Medicated,  Endo/Other  Morbid obesity  Renal/GU negative Renal ROS  negative genitourinary   Musculoskeletal  (+) Arthritis , Psoriatic arthritis on Humira   Abdominal (+) + obese,   Peds  Hematology negative hematology ROS (+)   Anesthesia Other Findings   Reproductive/Obstetrics (+) Pregnancy Gestational HTN                             Anesthesia Physical  Anesthesia Plan  ASA: 3  Anesthesia Plan: Epidural   Post-op Pain Management: Minimal or no pain anticipated   Induction:   PONV Risk Score and Plan:   Airway Management Planned: Natural Airway  Additional Equipment:   Intra-op Plan:   Post-operative Plan:   Informed Consent: I have reviewed the patients History and Physical, chart, labs and discussed the procedure including the risks, benefits and alternatives for the proposed anesthesia with the patient or authorized representative who has indicated his/her understanding and acceptance.       Plan Discussed with: Anesthesiologist  Anesthesia Plan Comments:         Anesthesia Quick Evaluation

## 2021-09-22 NOTE — Progress Notes (Addendum)
OB Progress Note  S: Patient resting comfortably. Feeling some mild cramping.  O: BP (!) 154/90   Pulse 80   Temp 98 F (36.7 C) (Oral)   Resp 16   Ht 5\' 6"  (1.676 m)   Wt (!) 189.7 kg   LMP 01/04/2021   BMI 67.52 kg/m   FHT: 135bpm, moderate variablity, + accels, - decels Toco: not graphing  SVE: 3.5/50/-3, sutures palpated  Bedside 02[birth year]06[birth year]1989: Cephalic, LOA  A/P: 26 y.o. G1P0 @ [redacted]w[redacted]d admitted for induction of labor for gestational HTN.  FWB: Cat. I Labor course: S/p Cytotec vaginally x 3 doses Pain: Per patient request GBS: Positive - receiving PCN Anticipate SVD  Will start pitocin and AROM with next cervical check. Patient strongly considering epidural for pain control after pitocin is started.  [redacted]w[redacted]d, DO

## 2021-09-22 NOTE — Telephone Encounter (Signed)
[facility]) asking to switch diclofenac (CATAFLAM) 50 MG tablet from potassium to Diclofenac Sodium for insurance coverage. Would like a call from the nurse

## 2021-09-23 ENCOUNTER — Encounter (HOSPITAL_COMMUNITY): Payer: Self-pay | Admitting: Obstetrics and Gynecology

## 2021-09-23 LAB — CBC
HCT: 37 % (ref 36.0–46.0)
Hemoglobin: 12.2 g/dL (ref 12.0–15.0)
MCH: 28.2 pg (ref 26.0–34.0)
MCHC: 33 g/dL (ref 30.0–36.0)
MCV: 85.5 fL (ref 80.0–100.0)
Platelets: 253 10*3/uL (ref 150–400)
RBC: 4.33 MIL/uL (ref 3.87–5.11)
RDW: 14.6 % (ref 11.5–15.5)
WBC: 22 10*3/uL — ABNORMAL HIGH (ref 4.0–10.5)
nRBC: 0.2 % (ref 0.0–0.2)

## 2021-09-23 MED ORDER — ZOLPIDEM TARTRATE 5 MG PO TABS: 5.0000 mg | ORAL_TABLET | Freq: Every evening | ORAL | Status: DC | PRN

## 2021-09-23 MED ORDER — MEASLES, MUMPS & RUBELLA VAC IJ SOLR
0.5000 mL | Freq: Once | INTRAMUSCULAR | Status: DC
Start: 2021-09-24 — End: 2021-09-25

## 2021-09-23 MED ORDER — IBUPROFEN 600 MG PO TABS
600.0000 mg | ORAL_TABLET | Freq: Four times a day (QID) | ORAL | Status: DC
  Administered 2021-09-23 – 2021-09-25 (×8): 600 mg via ORAL
  Filled 2021-09-23 (×8): qty 1

## 2021-09-23 MED ORDER — COCONUT OIL OIL: 1.0000 | TOPICAL_OIL | Status: DC | PRN

## 2021-09-23 MED ORDER — ACETAMINOPHEN 325 MG PO TABS: 650.0000 mg | ORAL_TABLET | ORAL | Status: DC | PRN

## 2021-09-23 MED ORDER — LIDOCAINE HCL (PF) 1 % IJ SOLN
INTRAMUSCULAR | Status: DC | PRN
  Administered 2021-09-22: 11 mL via EPIDURAL

## 2021-09-23 MED ORDER — DIBUCAINE (PERIANAL) 1 % EX OINT: 1.0000 | TOPICAL_OINTMENT | CUTANEOUS | Status: DC | PRN

## 2021-09-23 MED ORDER — OXYCODONE HCL 5 MG PO TABS: 10.0000 mg | ORAL_TABLET | Freq: Four times a day (QID) | ORAL | Status: DC | PRN

## 2021-09-23 MED ORDER — BUPIVACAINE HCL (PF) 0.25 % IJ SOLN
INTRAMUSCULAR | Status: DC | PRN
  Administered 2021-09-23: 10 mL via EPIDURAL

## 2021-09-23 MED ORDER — WITCH HAZEL-GLYCERIN EX PADS: 1.0000 | MEDICATED_PAD | CUTANEOUS | Status: DC | PRN

## 2021-09-23 MED ORDER — SENNOSIDES-DOCUSATE SODIUM 8.6-50 MG PO TABS
2.0000 | ORAL_TABLET | Freq: Every day | ORAL | Status: DC
Start: 2021-09-24 — End: 2021-09-25
  Administered 2021-09-24 – 2021-09-25 (×2): 2 via ORAL
  Filled 2021-09-23 (×2): qty 2

## 2021-09-23 MED ORDER — DIPHENHYDRAMINE HCL 25 MG PO CAPS: 25.0000 mg | ORAL_CAPSULE | Freq: Four times a day (QID) | ORAL | Status: DC | PRN

## 2021-09-23 MED ORDER — ONDANSETRON HCL 4 MG PO TABS: 4.0000 mg | ORAL_TABLET | ORAL | Status: DC | PRN

## 2021-09-23 MED ORDER — TRANEXAMIC ACID-NACL 1000-0.7 MG/100ML-% IV SOLN
INTRAVENOUS | Status: AC
  Administered 2021-09-23: 1000 mg
  Filled 2021-09-23: qty 100

## 2021-09-23 MED ORDER — ONDANSETRON HCL 4 MG/2ML IJ SOLN: 4.0000 mg | INTRAMUSCULAR | Status: DC | PRN

## 2021-09-23 MED ORDER — BENZOCAINE-MENTHOL 20-0.5 % EX AERO
1.0000 | INHALATION_SPRAY | CUTANEOUS | Status: DC | PRN
Start: 2021-09-23 — End: 2021-09-25

## 2021-09-23 MED ORDER — SIMETHICONE 80 MG PO CHEW: 80.0000 mg | CHEWABLE_TABLET | ORAL | Status: DC | PRN

## 2021-09-23 MED ORDER — OXYCODONE HCL 5 MG PO TABS: 5.0000 mg | ORAL_TABLET | Freq: Four times a day (QID) | ORAL | Status: DC | PRN

## 2021-09-23 MED ORDER — PRENATAL MULTIVITAMIN CH
1.0000 | ORAL_TABLET | Freq: Every day | ORAL | Status: DC
Start: 2021-09-24 — End: 2021-09-25
  Administered 2021-09-24 – 2021-09-25 (×2): 1 via ORAL
  Filled 2021-09-23 (×2): qty 1

## 2021-09-23 NOTE — Anesthesia Postprocedure Evaluation (Signed)
Anesthesia Post Note  Patient: Mita Vallo  Procedure(s) Performed: AN AD HOC LABOR EPIDURAL     Patient location during evaluation: Mother Baby Anesthesia Type: Epidural Level of consciousness: awake and alert Pain management: pain level controlled Vital Signs Assessment: post-procedure vital signs reviewed and stable Respiratory status: spontaneous breathing, nonlabored ventilation and respiratory function stable Cardiovascular status: stable Postop Assessment: no headache, no backache and epidural receding Anesthetic complications: no   No notable events documented.  Last Vitals:  Vitals:   09/23/21 1447 09/23/21 1502  BP: (!) 142/69 134/67  Pulse: (!) 107 (!) 109  Resp: 16 16  Temp:    SpO2:      Last Pain:  Vitals:   09/23/21 1510  TempSrc:   PainSc: 0-No pain   Pain Goal:                Epidural/Spinal Function Cutaneous sensation: Able to Discern Pressure (09/23/21 1447), Patient able to flex knees: No (09/23/21 1447), Patient able to lift hips off bed: No (09/23/21 1447), Back pain beyond tenderness at insertion site: No (09/23/21 1447), Progressively worsening motor and/or sensory loss: No (09/23/21 1447), Bowel and/or bladder incontinence post epidural: No (09/23/21 1447)  Zylpha Poynor

## 2021-09-23 NOTE — Progress Notes (Signed)
Received call from primary RN regarding FHT. Patient received epidural approximately 2345.  At approximately 0100 there were noted to be recurrent late deceleration to the 90s due to tachysystole. Recurrent lates resolved after pitocin was turned off. Patient's BP mild range. Currently, FHT baseline is 140bpm, moderate variability, - accel, -decel. Contractions are occurring every 2 minutes.   Patient's epidural is not giving the best pain relief - primary RN to contact anesthesia. Pitocin will stay off until epidural is re-evaluated to allow for fetal recovery for at least 30 minutes. Pitocin to be restarted at half (69mU/min) when appropriate.  Steva Ready, DO

## 2021-09-23 NOTE — Progress Notes (Signed)
OB Progress Note  S: Patient resting comfortably with epidural. She was able to get some sleep.   O: BP (!) 145/80   Pulse (!) 104   Temp 98.2 F (36.8 C) (Oral)   Resp 16   Ht 5\' 6"  (1.676 m)   Wt (!) 189.7 kg   LMP 01/04/2021   SpO2 98%   BMI 67.52 kg/m   FHT: 135bpm, moderate variablity, + accels, three late decels Toco: q1-5 minutes SVE: 6.5/90/-2  A/P: 26 y.o. G1P0 @ [redacted]w[redacted]d admitted for induction of labor for gestational HTN.  FWB: Cat. I Labor course: S/p Cytotec vaginally x 3 doses, FSE/IUPC in place, Pitocin at 60mU/min, MVUs are now 180-200 Pain: Epidural GBS: Positive - receiving PCN Anticipate SVD  Patient turned to left side after three decelerations. Currently, FHT Category I.  12m, DO

## 2021-09-23 NOTE — Progress Notes (Signed)
OB Progress Note  S: Patient resting comfortably now with epidural (replaced).   O: BP (!) 145/67   Pulse (!) 106   Temp 97.7 F (36.5 C) (Axillary)   Resp 16   Ht 5\' 6"  (1.676 m)   Wt (!) 189.7 kg   LMP 01/04/2021   SpO2 98%   BMI 67.52 kg/m   FHT: 135bpm, moderate variablity, + accels, - decels Toco: not graphing SVE: 5/80/-2, sutures palpated  A/P: 26 y.o. G1P0 @ [redacted]w[redacted]d admitted for induction of labor for gestational HTN.  FWB: Cat. I Labor course: S/p Cytotec vaginally x 3 doses, FSE in place, IUPC readjusted (previously dislodged), Pitocin turned off overnight due to tachysystole with recurrent lates and while awaiting replacement of epidural Pain: Epidural GBS: Positive - receiving PCN Anticipate SVD  Pitocin restarted at 66mU/min and will titrate 2x2 as tolerated.  3m, DO

## 2021-09-23 NOTE — Lactation Note (Signed)
This note was copied from a baby's chart. Lactation Consultation Note  Patient Name: Tiffany Miles [id number]'H Date: 09/23/2021 Reason for consult: L&D Initial assessment;Difficult latch;Primapara;Early term 37-38.6wks;Breastfeeding assistance;Other (Comment) (LCL/D visit at less than 60 mins, As LC entered the room birthing parent attempting to latch. LC offerd to assist, Birthing parent receptive. Easily hand exp and baby latched for approx 5 mins ( swallows ) released. on and off.) Age:> 60 mins ,  Latch score 8  BF parent aware she will be seen on MBU.  Maternal Data Has patient been taught Hand Expression?: Yes Does the patient have breastfeeding experience prior to this delivery?: No  Feeding Mother's Current Feeding Choice: Breast Milk  LATCH Score Latch: Grasps breast easily, tongue down, lips flanged, rhythmical sucking.  Audible Swallowing: A few with stimulation  Type of Nipple: Everted at rest and after stimulation  Comfort (Breast/Nipple): Soft / non-tender  Hold (Positioning): Assistance needed to correctly position infant at breast and maintain latch.  LATCH Score: 8   Lactation Tools Discussed/Used    Interventions Interventions: Breast feeding basics reviewed;Assisted with latch;Skin to skin;Breast compression;Education  Discharge    Consult Status Consult Status: Follow-up Date: 09/23/21 Follow-up type: In-patient    Matilde Sprang Tivon Lemoine 09/23/2021, 3:05 PM

## 2021-09-23 NOTE — Progress Notes (Signed)
Epidural cath removed at 1715 intact

## 2021-09-24 LAB — CBC
HCT: 31.7 % — ABNORMAL LOW (ref 36.0–46.0)
Hemoglobin: 10.5 g/dL — ABNORMAL LOW (ref 12.0–15.0)
MCH: 28.5 pg (ref 26.0–34.0)
MCHC: 33.1 g/dL (ref 30.0–36.0)
MCV: 86.1 fL (ref 80.0–100.0)
Platelets: 226 10*3/uL (ref 150–400)
RBC: 3.68 MIL/uL — ABNORMAL LOW (ref 3.87–5.11)
RDW: 15 % (ref 11.5–15.5)
WBC: 15.4 10*3/uL — ABNORMAL HIGH (ref 4.0–10.5)
nRBC: 0 % (ref 0.0–0.2)

## 2021-09-24 MED ORDER — RHO D IMMUNE GLOBULIN 1500 UNIT/2ML IJ SOSY
300.0000 ug | PREFILLED_SYRINGE | Freq: Once | INTRAMUSCULAR | Status: AC
Start: 2021-09-24 — End: 2021-09-24
  Administered 2021-09-24: 300 ug via INTRAVENOUS
  Filled 2021-09-24: qty 2

## 2021-09-24 MED ORDER — IBUPROFEN 600 MG PO TABS: 600.0000 mg | ORAL_TABLET | Freq: Four times a day (QID) | ORAL | 1 refills | Status: AC | PRN

## 2021-09-24 NOTE — Lactation Note (Signed)
This note was copied from a baby's chart. Lactation Consultation Note  Patient Name: Tiffany Miles FYBOF'B Date: 09/24/2021 Reason for consult: Initial assessment;1st time breastfeeding;Early term 37-38.6wks, LC request for latch assistance. Age:25 hours, P1, ETI female infant. Birth Parent has Spectra 2 DEBP at home. Birth Parent latched infant on right breast using the football hold position, infant was on and off the breast for 12 minutes. Birth Parent will continue to work on latching infant at the breast and ask RN/LC for further latch assistance if needed.  Continue to BF infant according to hunger cues, on demand, 8 to 12+ times within 24 hours, STS. [facility] taught Birth Parent how to hand express and Birth Parent easily expressed 8 mls of colostrum that was spoon feed to infant.  Birth Parent knows to hand express and give infant back EBM if infant doesn't latch at the breast. Birth Parent  made aware of O/P services, breastfeeding support groups, community resources, and our phone # for post-discharge questions.   Maternal Data Has patient been taught Hand Expression?: Yes Does the patient have breastfeeding experience prior to this delivery?: No  Feeding Mother's Current Feeding Choice: Breast Milk  LATCH Score Latch: Repeated attempts needed to sustain latch, nipple held in mouth throughout feeding, stimulation needed to elicit sucking reflex.  Audible Swallowing: A few with stimulation  Type of Nipple: Everted at rest and after stimulation  Comfort (Breast/Nipple): Soft / non-tender  Hold (Positioning): Assistance needed to correctly position infant at breast and maintain latch.  LATCH Score: 7   Lactation Tools Discussed/Used    Interventions Interventions: Breast feeding basics reviewed;Assisted with latch;Skin to skin;Adjust position;Breast compression;Support pillows;Position options;Expressed milk;Education;LC Services brochure  Discharge    Consult  Status Consult Status: Follow-up Date: 09/24/21 Follow-up type: In-patient    Danelle Earthly 09/24/2021, 3:15 AM

## 2021-09-24 NOTE — Lactation Note (Addendum)
This note was copied from a baby's chart. Lactation Consultation Note  Patient Name: Tiffany Miles Date: 09/24/2021 Reason for consult: Follow-up assessment;Mother's request;Difficult latch;Early term 37-38.6wks;Hyperbilirubinemia;Breastfeeding assistance Age:25 hours  Infant started on double phototherapy. Birth parent struggling with latch called for assistance.  LC attempted a latch, infant arching her back not able to sustain it. LC gave 5 ml of EBM tried again with similar results.    Birth parent consented to use of DBM to give more volume. For now using formula, until am when milk bank opens and more is available. Infant took 15 ml of formula, fell asleep.   Plan 1. To feed based on cues 8-12x 24hr period. Mom to offer breasts and look for signs of milk transfer.  2. Birth parent to supplement with EBM first followed by [facility]. If infant not able to sustain the latch, feeding volume 15 ml or more. BF supplementation volume guide provided.  3 Post pump after each feeding for 15 mins.   All questions answered at the end of the visit.  Maternal Data Has patient been taught Hand Expression?: Yes  Feeding Mother's Current Feeding Choice: Breast Milk and Donor Milk  LATCH Score Latch: Repeated attempts needed to sustain latch, nipple held in mouth throughout feeding, stimulation needed to elicit sucking reflex.  Audible Swallowing: A few with stimulation  Type of Nipple: Everted at rest and after stimulation  Comfort (Breast/Nipple): Soft / non-tender  Hold (Positioning): Assistance needed to correctly position infant at breast and maintain latch.  LATCH Score: 7   Lactation Tools Discussed/Used Tools: Pump;Flanges Breast pump type: Double-Electric Breast Pump Pump Education: Setup, frequency, and cleaning;Milk Storage Reason for Pumping: increase stimulation Pumping frequency: post pump after each feeding for 15 mins  Interventions Interventions: Breast feeding  basics reviewed;Assisted with latch;Skin to skin;Breast massage;Hand express;Breast compression;Adjust position;Support pillows;Position options;Expressed milk;DEBP;Education;Pace feeding;Infant Driven Feeding Algorithm education  Discharge Pump: DEBP  Consult Status Consult Status: Follow-up Date: 09/25/21 Follow-up type: In-patient    Tiffany Martenson  Miles 09/24/2021, 9:46 PM

## 2021-09-24 NOTE — Lactation Note (Signed)
This note was copied from a baby's chart. Lactation Consultation Note  Patient Name: Tiffany Miles [id number] Date: 09/24/2021 Reason for consult: Follow-up assessment;Mother's request;Primapara;1st time breastfeeding;Early term 37-38.6wks Age:25 hours   P1 - Early term infant at 37+3 weeks Feeding preference: Breast  Birth parent requested latch assistance.  Birth parent concerned that baby "[name]" has not fed since 0300.  Offered to assist with waking and latching; parent receptive.  Reviewed hand expression and finger fed colostrum drops to "[name]."  Assisted to latch easily in the football hold to the right breast; "[name]" fed on/off for 5 minutes and became too sleepy to contine.  Placed her STS on birth parent's chest.  Breast feeding basics reviewed and discussed the possibility of cluster feeding tonight.  Encouraged lots of STS, breast massage and hand expression.  Suggested birth mother call her RN/LC for latch assistance as needed.  Allowed time for questions.  Support person present and both parents interested in learning about newborn care and breast feeding.   Maternal Data Has patient been taught Hand Expression?: Yes Does the patient have breastfeeding experience prior to this delivery?: No  Feeding Mother's Current Feeding Choice: Breast Milk  LATCH Score Latch: Repeated attempts needed to sustain latch, nipple held in mouth throughout feeding, stimulation needed to elicit sucking reflex.  Audible Swallowing: None  Type of Nipple: Everted at rest and after stimulation  Comfort (Breast/Nipple): Soft / non-tender  Hold (Positioning): Assistance needed to correctly position infant at breast and maintain latch.  LATCH Score: 6   Lactation Tools Discussed/Used    Interventions Interventions: Breast feeding basics reviewed;Assisted with latch;Skin to skin;Breast massage;Hand express;Breast compression;Expressed milk;Position options;Support  pillows;Education  Discharge Pump: Personal  Consult Status Consult Status: Follow-up Date: 09/25/21 Follow-up type: In-patient    Taevyn Hausen R Kayal Mula 09/24/2021, 8:41 AM

## 2021-09-24 NOTE — Progress Notes (Addendum)
Postpartum Note Day #1  S:  Patient doing well.  Pain controlled.  Tolerating regular diet.   Ambulating and voiding without difficulty.   Denies fevers, chills, chest pain, SOB, N/V, or worsening bilateral LE edema.  Lochia: Minimal Infant feeding:  Breast Circumcision:  N/A, female infant Contraception:  Nuvaring vs Nexplanon  O: Temp:  [98.2 F (36.8 C)-98.8 F (37.1 C)] 98.2 F (36.8 C) (07/26 0330) Pulse Rate:  [84-133] 86 (07/26 0330) Resp:  [16] 16 (07/26 0330) BP: (104-157)/(48-94) 127/62 (07/26 0330) SpO2:  [99 %] 99 % (07/25 2334) Gen: NAD, pleasant and cooperative Resp: No increased work of breathing Abdomen: soft, non-distended, non-tender throughout Uterus: firm, non-tender, below umbilicus Ext: Trace bilateral LE edema, no bilateral calf tenderness  Labs:  Recent Labs    09/23/21 1608 09/24/21 0502  HGB 12.2 10.5*  HCT 37.0 31.7*    A/P: Patient is a 26 y.o. G1P1001 PPD#1 s/p SVD.  S/p SVD - Pain well controlled  - GU: UOP is adequate - GI: Tolerating regular diet - Activity: encouraged sitting up to chair and ambulation as tolerated - DVT Prophylaxis: Ambulation, SCDs while in bed - Labs: as above  Gestational HTN - Blood pressures normotensive since delivery - Medication: None - 1 week postpartum BP check scheduled  Migraines with visual disturbances - Stable, no current issues - Fioricet PRN  Rh negative - Rhogam eval ordered  Depression - Lexapro 72m daily ordered - 1 week EPDS arranged  Rubella non-immune - MMR ordered  Disposition:  D/C home PPD#2.   MDrema Dallas DO [phone](office)

## 2021-09-24 NOTE — Social Work (Signed)
MOB was referred for history of depression/anxiety. * Referral screened out by Clinical Social Worker because none of the following criteria appear to apply:  ~ History of anxiety/depression during this pregnancy, or of post-partum depression following prior delivery.  ~ Diagnosis of anxiety and/or depression within last 3 years OR * MOB's symptoms currently being treated with medication and/or therapy.  Per chart review MOB has an active prescription for Lexapro.   Please contact the Clinical Social Worker if needs arise, by [facility] request, or if MOB scores greater than 9/yes to question 10 on Edinburgh Postpartum Depression Screen.   Wende Neighbors Clinical Social Worker [phone]

## 2021-09-25 LAB — RH IG WORKUP (INCLUDES ABO/RH)
Fetal Screen: NEGATIVE
Gestational Age(Wks): 37.3
Unit division: 0

## 2021-09-25 LAB — SURGICAL PATHOLOGY

## 2021-09-25 MED ORDER — HYDROCORTISONE 0.5 % EX CREA
TOPICAL_CREAM | Freq: Three times a day (TID) | CUTANEOUS | Status: DC
  Filled 2021-09-25: qty 28.35

## 2021-09-25 NOTE — Progress Notes (Signed)
Patient complaining of rash to back from epidural tape and was requesting a cream to apply. Despite discharge orders, patient will still be in hospital at least through this afternoon to monitor infant's jaundice level. Called Dr. Connye Burkitt for order for cream. Lajuana Matte

## 2021-09-25 NOTE — Social Work (Signed)
CSW received consult for an [place] of 10. CSW met with MOB to offer support and complete assessment.    CSW entered room and introduced CSW role and reason for visit. MOB was agreeable to visit. CSW offered privacy, MOB allowed FOB to remain in the room. CSW inquired about how mom was feeling. MOB reported she was feeling good. CSW inquired about MOBs mental health history. MOB reported she was diagnosed with Anxiety and Depression about 10 years ago. MOB reported she is currently on Lexapro and it is working for her. CSW inquired about therapy. MOB reported she is not currently in therapy. CSW accessed for safety, MOB denied any SI or HI. MOB identified her supports to be FOB, her sister and her best friends.   CSW provided education regarding the baby blues period vs. perinatal mood disorders, discussed treatment and gave resources for mental health follow up if concerns arise.  CSW recommends self-evaluation during the postpartum time period using the New Mom Checklist from Postpartum Progress and encouraged MOB to contact a medical professional if symptoms are noted at any time.    CSW provided review of Sudden Infant Death Syndrome (SIDS) precautions. MOB reported they have all necessary items for the infant including a bassinet where she will sleep.    CSW identifies no further need for intervention and no barriers to discharge at this time.   [place] Work  [phone]

## 2021-09-25 NOTE — Discharge Summary (Signed)
Postpartum Discharge Summary  Date of Service: 09/25/21    Patient Name: Tiffany Miles DOB: November 24, 1995 MRN: [id number]  Date of admission: 09/22/2021 Delivery date:09/23/2021  Delivering provider: Drema Dallas  Date of discharge: 09/25/2021  Admitting diagnosis: Gestational hypertension [O13.9] Intrauterine pregnancy: [redacted]w[redacted]d    Secondary diagnosis:  Principal Problem:   Gestational hypertension  Additional problems:  Migraines with visual disturbances  Psoriatic arthritis  Obesity (BMI 67) Rh negative  Depression  Rubella non-immune GERD Discharge diagnosis: Term Pregnancy Delivered                                              Post partum procedures: None Augmentation: AROM, Pitocin, and Cytotec Complications: None  Hospital course: Induction of Labor With Vaginal Delivery   26y.o. yo G1P1001 at 386w3das admitted to the hospital 09/22/2021 for induction of labor.  Indication for induction: Gestational hypertension.  Patient had an uncomplicated labor course as follows: Membrane Rupture Time/Date: 5:25 PM ,09/22/2021   Delivery Method:Vaginal, Spontaneous  Episiotomy: None  Lacerations:  2nd degree  Details of delivery can be found in separate delivery note.  Patient had a routine postpartum course. Patient is discharged home 09/25/21.  Newborn Data: Birth date:09/23/2021  Birth time:1:59 PM  Gender:Female  Living status:Living  Apgars:8 ,9  Weight:3060 g   Magnesium Sulfate received: No BMZ received: No Rhophylac:N/A MMR:N/A T-DaP:Given prenatally Flu: Yes last flu season Transfusion:No  Physical exam  Vitals:   09/24/21 1000 09/24/21 1747 09/24/21 2143 09/25/21 0630  BP: 127/66 131/81 126/82 115/71  Pulse: 78 83 93 93  Resp: _0 Temp: 98.2 F (36.8 C) 98.2 F (36.8 C) 98 F (36.7 C) 97.8 F (36.6 C)  TempSrc: Oral Oral Oral Oral  SpO2:  100% 100% 99%  Weight:      Height:       General: alert, cooperative, and no distress Cardio:   RRR Lungs: CTAB, no wheezes/rales/rhonchi Lochia: appropriate Uterine Fundus: firm Incision: N/A DVT Evaluation: No evidence of DVT seen on physical exam. No cords or calf tenderness. Labs: Lab Results  Component Value Date   WBC 15.4 (H) 09/24/2021   HGB 10.5 (L) 09/24/2021   HCT 31.7 (L) 09/24/2021   MCV 86.1 09/24/2021   PLT 226 09/24/2021      Latest Ref Rng & Units 09/22/2021    1:23 AM  CMP  Glucose 70 - 99 mg/dL 106   BUN 6 - 20 mg/dL 8   Creatinine 0.44 - 1.00 mg/dL 0.77   Sodium 135 - 145 mmol/L 138   Potassium 3.5 - 5.1 mmol/L 3.6   Chloride 98 - 111 mmol/L 109   CO2 22 - 32 mmol/L 22   Calcium 8.9 - 10.3 mg/dL 8.8   Total Protein 6.5 - 8.1 g/dL 5.3   Total Bilirubin 0.3 - 1.2 mg/dL 0.3   Alkaline Phos 38 - 126 U/L 104   AST 15 - 41 U/L 16   ALT 0 - 44 U/L 12    Edinburgh Score:     No data to display            After visit meds:  Allergies as of 09/25/2021       Reactions   Prednisone Other (See Comments)   Causes migraines        Medication List  TAKE these medications    acetaminophen 500 MG tablet Commonly known as: TYLENOL Take 1,000 mg by mouth every 4 (four) hours as needed for headache.   albuterol 108 (90 Base) MCG/ACT inhaler Commonly known as: VENTOLIN HFA Inhale 2 puffs into the lungs every 4 (four) hours as needed for wheezing or shortness of breath.   butalbital-acetaminophen-caffeine 50-325-40 MG tablet Commonly known as: FIORICET Take 2 tablets by mouth every 6 (six) hours as needed for headache.   cetirizine 10 MG tablet Commonly known as: ZYRTEC Take 10 mg by mouth daily as needed (seasonal allergies).   cyproheptadine 4 MG tablet Commonly known as: PERIACTIN Take 1 tablet (4 mg total) by mouth 3 (three) times daily as needed (headache).   diclofenac 50 MG EC tablet Commonly known as: VOLTAREN Take 1 tablet (50 mg total) by mouth daily as needed (for migraine).   docusate sodium 100 MG capsule Commonly  known as: COLACE Take 200 mg by mouth every morning.   escitalopram 20 MG tablet Commonly known as: LEXAPRO Take 20 mg by mouth every morning.   famotidine 20 MG tablet Commonly known as: PEPCID Take 20 mg by mouth every morning.   fluticasone 50 MCG/ACT nasal spray Commonly known as: FLONASE Place 1 spray into both nostrils daily as needed (seasonal allergies).   Humira Pen 40 MG/0.4ML Pnkt Generic drug: Adalimumab Inject 40 mg into the skin every Thursday.   ibuprofen 600 MG tablet Commonly known as: ADVIL Take 1 tablet (600 mg total) by mouth every 6 (six) hours as needed for mild pain, moderate pain or cramping.   multivitamin-prenatal 27-0.8 MG Tabs tablet Take 1 tablet by mouth every morning.   pantoprazole 40 MG tablet Commonly known as: PROTONIX Take 1 tablet (40 mg total) by mouth at bedtime. What changed: when to take this         Discharge home in stable condition Infant Feeding: Bottle and Breast Infant Disposition:home with mother Discharge instruction: per After Visit Summary and Postpartum booklet. Activity: Advance as tolerated. Pelvic rest for 6 weeks.  Diet: low salt diet Anticipated Birth Control: Unsure Postpartum Appointment:6 weeks Additional Postpartum F/U: Postpartum Depression checkup and BP check 1 week Future Appointments: Future Appointments  Date Time Provider [place]  12/18/2021  3:45 PM Suzzanne Cloud, NP [facility] None   Follow up Visit:  Follow-up Information     Drema Dallas, DO Follow up in 1 week(s).   Specialty: Obstetrics and Gynecology Why: Please keep your previously schedule 1 week blood pressure check and 7 week postpartum visit. Contact information: [address] [place] [address] [place] [address] [phone]                     09/25/2021 Drema Dallas, DO

## 2021-09-26 ENCOUNTER — Other Ambulatory Visit: Payer: No Typology Code available for payment source

## 2021-09-26 ENCOUNTER — Ambulatory Visit: Payer: No Typology Code available for payment source

## 2021-09-27 ENCOUNTER — Ambulatory Visit: Payer: Self-pay

## 2021-09-27 NOTE — Lactation Note (Signed)
This note was copied from a baby's chart. Lactation Consultation Note  Patient Name: Tiffany Miles LYHTM'B Date: 09/27/2021 Reason for consult: Initial assessment;Mother's request;Exclusive pumping and bottle feeding;Early term 37-38.6wks;Hyperbilirubinemia Age:26 years  Infant here for treatment of hyperbilirubinemia. Infant formula feeding only. Birth parent interested in starting pumping and offering EBM in a bottle. LC reviewed Medical hx and medications. Birth parent states taking humira discontinued during pregnancy but will restart at 40 mg IV once per week.  LC reviewed medication in [facility] and [facility] (L3 medication). LC discussed medication with providers, stated not safe for breastfeeding as related to birth parent.  Birth parent also encouraged to review medication with Pediatrician after discharge.   LC completed since infant formula feeding only.   Maternal Data    Feeding Mother's Current Feeding Choice: Formula  LATCH Score                    Lactation Tools Discussed/Used    Interventions Interventions: Expressed milk;Education  Discharge    Consult Status Consult Status: Complete Date: 09/28/21 Follow-up type: In-patient    Arasely Akkerman  Nicholson-Springer 09/27/2021, 2:39 PM

## 2021-10-03 ENCOUNTER — Ambulatory Visit: Payer: No Typology Code available for payment source

## 2021-10-10 ENCOUNTER — Ambulatory Visit: Payer: No Typology Code available for payment source

## 2021-12-03 ENCOUNTER — Telehealth: Payer: Self-pay | Admitting: Neurology

## 2021-12-03 NOTE — Telephone Encounter (Signed)
LVM and sent mychart msg informing pt of need to reschedule 10/19 appointment - NP out 

## 2021-12-18 ENCOUNTER — Ambulatory Visit: Payer: No Typology Code available for payment source | Admitting: Neurology

## 2022-03-19 ENCOUNTER — Encounter: Payer: Self-pay | Admitting: Psychiatry

## 2022-03-23 ENCOUNTER — Other Ambulatory Visit: Payer: Self-pay | Admitting: *Deleted

## 2023-02-07 IMAGING — MR MR MRV HEAD W/O CM
3 series · 44 of 48 positions shown · non-contrast
Comparison: 07/18/2014 MR head

CLINICAL DATA: Vision changes, headache

EXAM:
MRI HEAD WITHOUT CONTRAST
MR VENOGRAM HEAD WITHOUT CONTRAST
TECHNIQUE: Multiplanar, multi-echo pulse sequences of the brain and surrounding
structures were acquired without intravenous contrast. Angiographic
images of the intracranial venous structures were acquired using MRV
technique without intravenous contrast.

[Series 1: tof_fl2d_paracor · coronal · 2.0mm · 0.98mm/px · 12 of 155 slices shown]
[im 1/155]
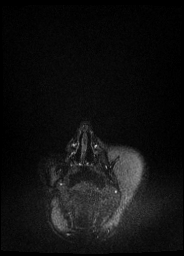
[im 15/155]
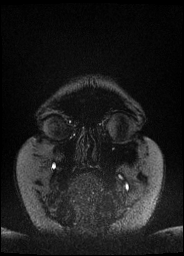
[im 29/155]
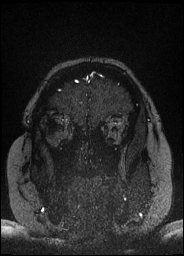
[im 43/155]
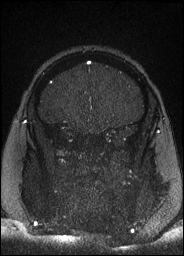
[im 57/155]
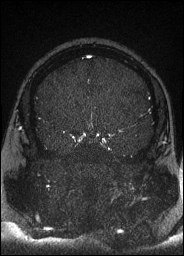
[im 71/155]
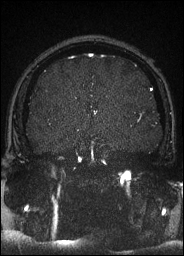
[im 85/155]
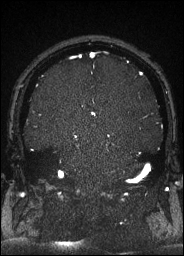
[im 99/155]
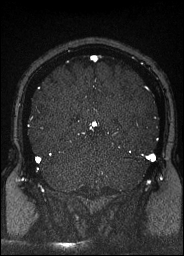
[im 113/155]
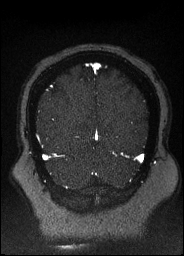
[im 127/155]
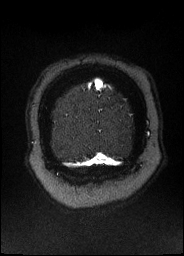
[im 141/155]
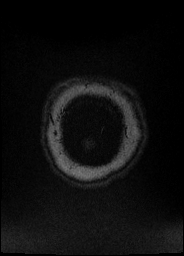
[im 155/155]
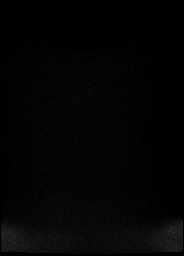

[Series 5: venous inhance coronal · coronal · portal-venous · 0.9mm · 0.57mm/px · 18 of 224 slices shown]
[im 1/224]
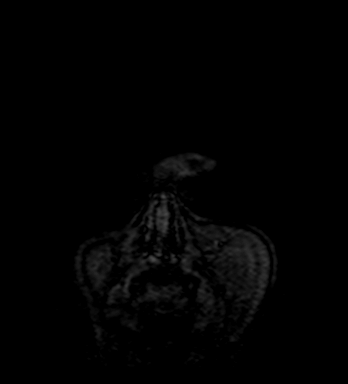
[im 14/224]
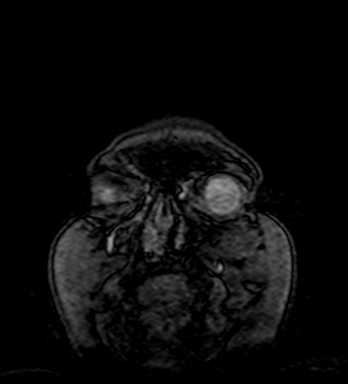
[im 27/224]
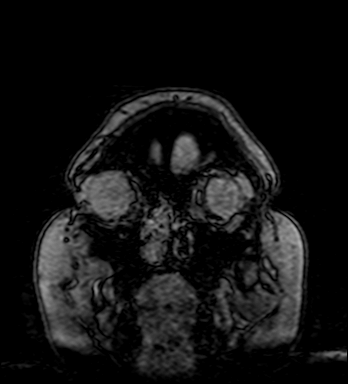
[im 40/224]
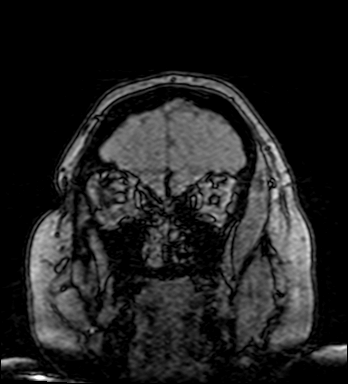
[im 53/224]
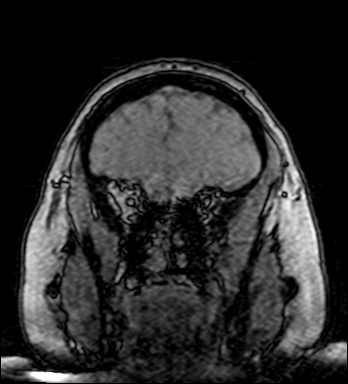
[im 66/224]
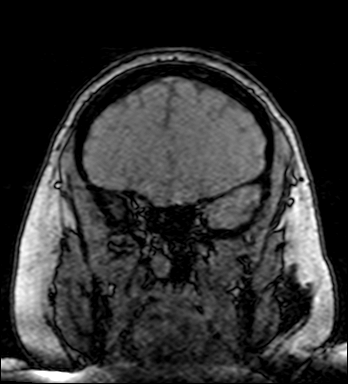
[im 79/224]
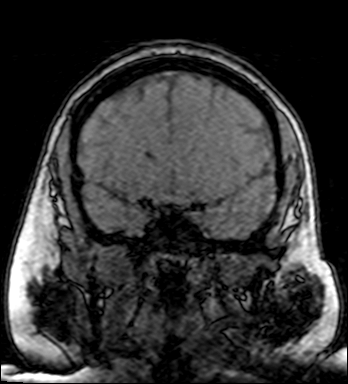
[im 92/224]
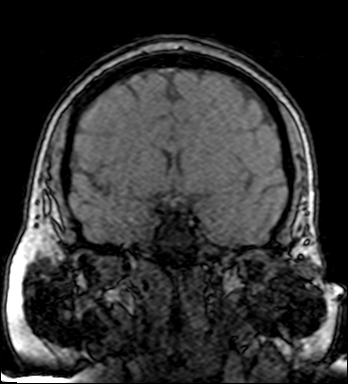
[im 105/224]
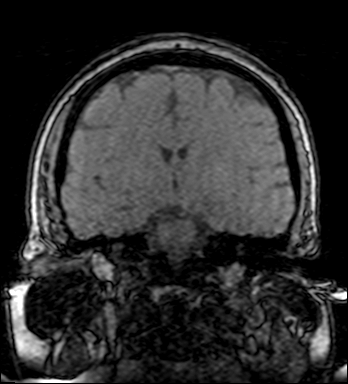
[im 119/224]
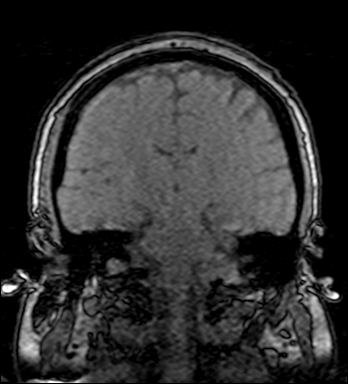
[im 132/224]
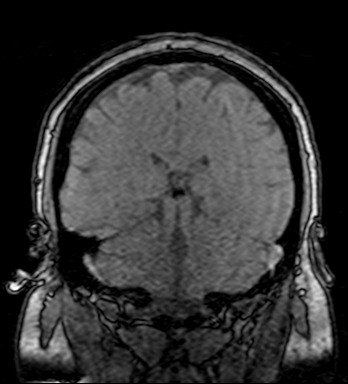
[im 145/224]
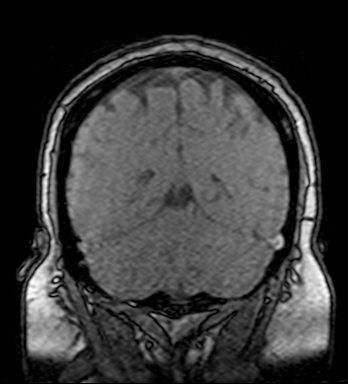
[im 158/224]
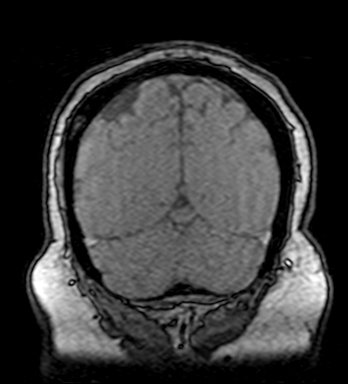
[im 171/224]
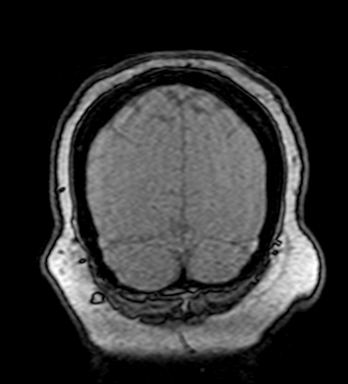
[im 184/224]
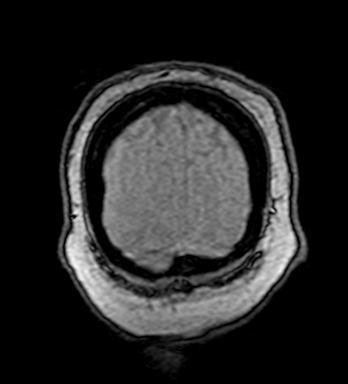
[im 197/224]
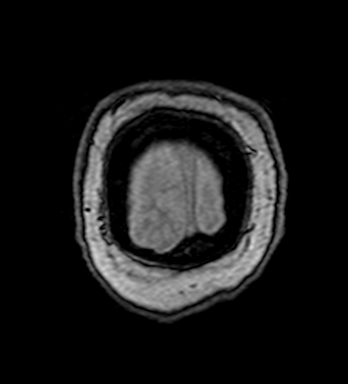
[im 210/224]
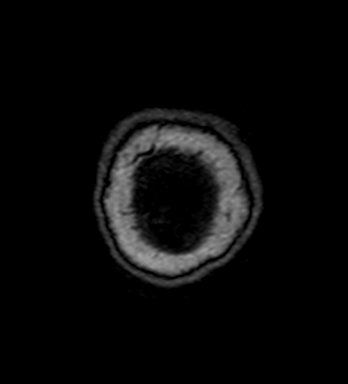
[im 224/224]
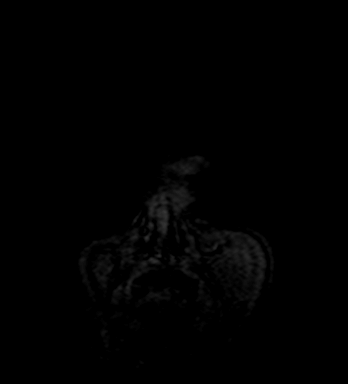

[Series 6: venous inhance coronal_msum · coronal · portal-venous · 0.9mm · 0.57mm/px · 14 of 221 slices shown]
[im 1/221]
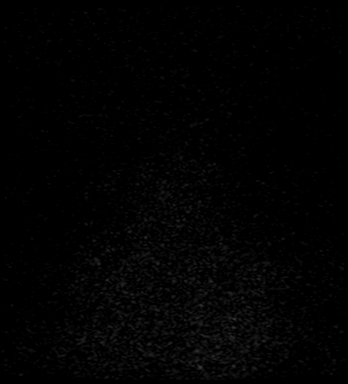
[im 13/221]
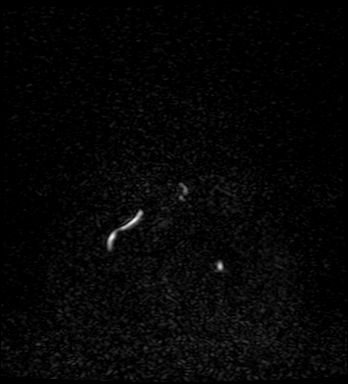
[im 26/221]
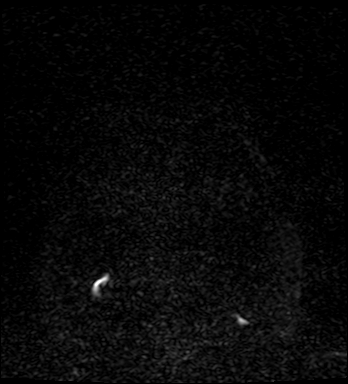
[im 39/221]
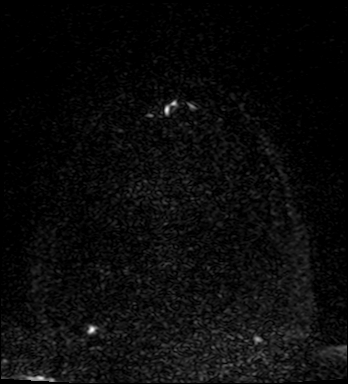
[im 52/221]
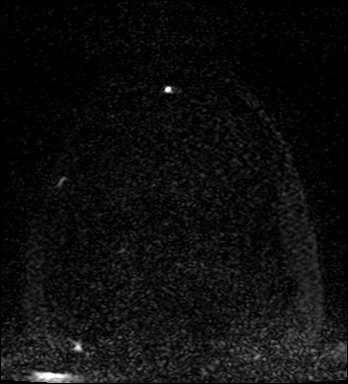
[im 65/221]
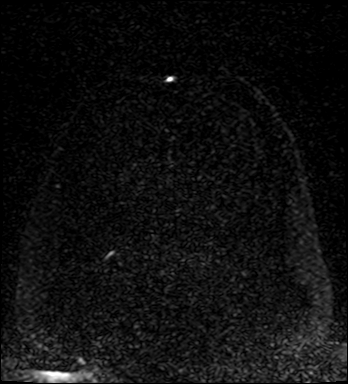
[im 78/221]
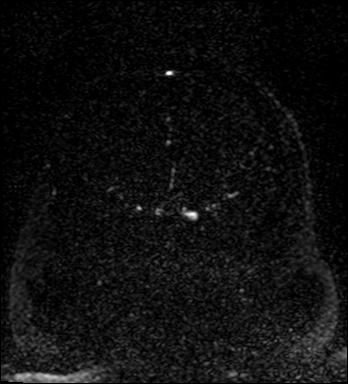
[im 91/221]
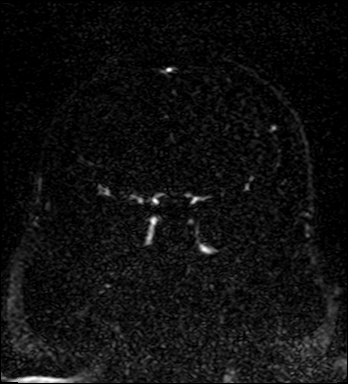
[im 117/221]
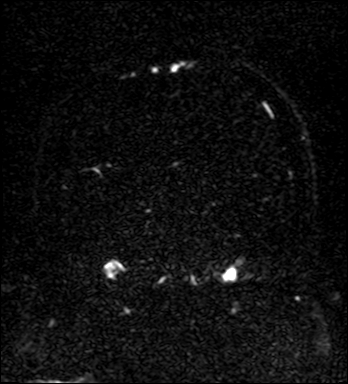
[im 130/221]
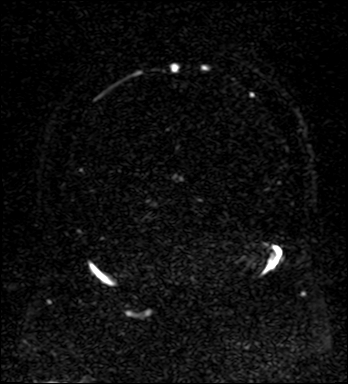
[im 156/221]
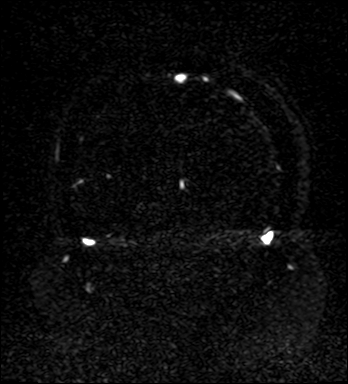
[im 182/221]
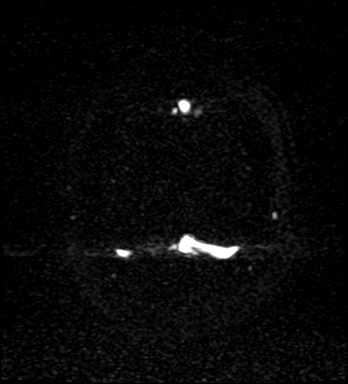
[im 195/221]
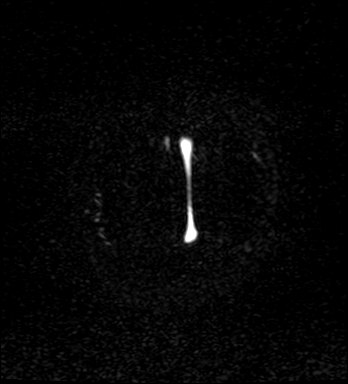
[im 208/221]
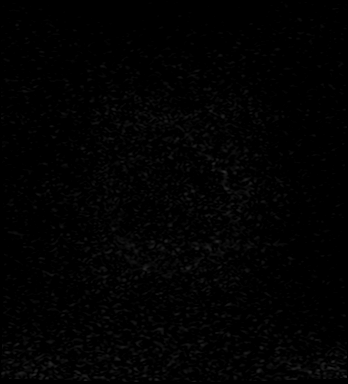

[44 of 48 positions shown; findings below may reference images not displayed]

FINDINGS: MRI HEAD WITHOUT CONTRAST

Brain: No restricted diffusion to suggest acute or subacute infarct.
No acute hemorrhage, edema, mass, mass effect, or midline shift. No
hemosiderin deposition to suggest remote hemorrhage. No
hydrocephalus or extra-axial collection. Normal pituitary. Normal
craniocervical junction.

Vascular: Normal arterial flow voids.

Skull and upper cervical spine: Normal marrow signal.

Sinuses/Orbits: No acute finding.

Other: The mastoids are well aerated.

MR VENOGRAM HEAD WITHOUT  CONTRAST

There is no evidence of dural venous sinus or deep cerebral vein
thrombosis. No dural venous sinus stenosis.
IMPRESSION: 1. No acute intracranial process. No etiology seen for the patient's
headache or vision changes.
2. Negative for dural venous sinus or deep cerebral vein thrombosis.
